# Patient Record
Sex: Male | Born: 1935 | Race: White | Hispanic: No | Marital: Married | Smoking: Never smoker
Health system: Southern US, Community
[De-identification: ages and names within clinical notes are randomized; demographics above are authoritative.]

## PROBLEM LIST (undated history)

## (undated) DIAGNOSIS — I2510 Atherosclerotic heart disease of native coronary artery without angina pectoris: Secondary | ICD-10-CM

## (undated) DIAGNOSIS — I10 Essential (primary) hypertension: Secondary | ICD-10-CM

## (undated) DIAGNOSIS — E785 Hyperlipidemia, unspecified: Secondary | ICD-10-CM

## (undated) DIAGNOSIS — I319 Disease of pericardium, unspecified: Secondary | ICD-10-CM

## (undated) HISTORY — PX: CARDIAC CATHETERIZATION: SHX172

## (undated) HISTORY — DX: Disease of pericardium, unspecified: I31.9

## (undated) HISTORY — PX: CORONARY ARTERY BYPASS GRAFT: SHX141

## (undated) HISTORY — PX: ARTERIOVENOUS GRAFT PLACEMENT W/ ENDOSCOPIC VEIN HARVEST: SUR1030

## (undated) HISTORY — DX: Essential (primary) hypertension: I10

## (undated) HISTORY — DX: Hyperlipidemia, unspecified: E78.5

## (undated) HISTORY — DX: Atherosclerotic heart disease of native coronary artery without angina pectoris: I25.10

---

## 2002-08-16 ENCOUNTER — Encounter: Admission: RE | Admit: 2002-08-16 | Discharge: 2002-08-16 | Payer: Self-pay | Admitting: General Surgery

## 2002-08-16 ENCOUNTER — Encounter: Payer: Self-pay | Admitting: General Surgery

## 2002-08-19 ENCOUNTER — Ambulatory Visit (HOSPITAL_BASED_OUTPATIENT_CLINIC_OR_DEPARTMENT_OTHER): Admission: RE | Admit: 2002-08-19 | Discharge: 2002-08-19 | Payer: Self-pay | Admitting: General Surgery

## 2002-10-22 ENCOUNTER — Encounter (INDEPENDENT_AMBULATORY_CARE_PROVIDER_SITE_OTHER): Payer: Self-pay | Admitting: *Deleted

## 2002-10-22 ENCOUNTER — Ambulatory Visit (HOSPITAL_BASED_OUTPATIENT_CLINIC_OR_DEPARTMENT_OTHER): Admission: RE | Admit: 2002-10-22 | Discharge: 2002-10-22 | Payer: Self-pay | Admitting: General Surgery

## 2003-04-01 ENCOUNTER — Encounter: Admission: RE | Admit: 2003-04-01 | Discharge: 2003-04-01 | Payer: Self-pay | Admitting: General Surgery

## 2004-10-29 IMAGING — US US SCROTUM
1 series · 14 of 25 positions shown · non-contrast
Comparison: none

CLINICAL DATA: Left testicular pain.
 ULTRASOUND OF THE SCROTUM
 Scans over the scrotum were performed.  Both testicles are well visualized and normal in echogenicity.  There is blood flow to both testicles.   The right testicle measures 3.7 cm sagittally with a depth of 2.7 cm and width of 2.9 cm.  The left testicle measures 4.5 cm sagittally with a depth of 2.0 cm and a width of 3.5 cm.  The epididymis appears normal bilaterally.  There are small bilateral hydroceles with septations.  Inferior to the testicles, there are small rounded soft tissue masses, most consistent with ?scrotal pearls? which are a product of prior inflammation. 
 IMPRESSION
 1.  No acute abnormality.  Blood flow is documented to both testicles.  
 2.  Small hydrocele with septation and scrotal pearls bilaterally.

[Series 1: unknown · 0.09mm/px · 14 of 42 slices shown]
[im 1/42]
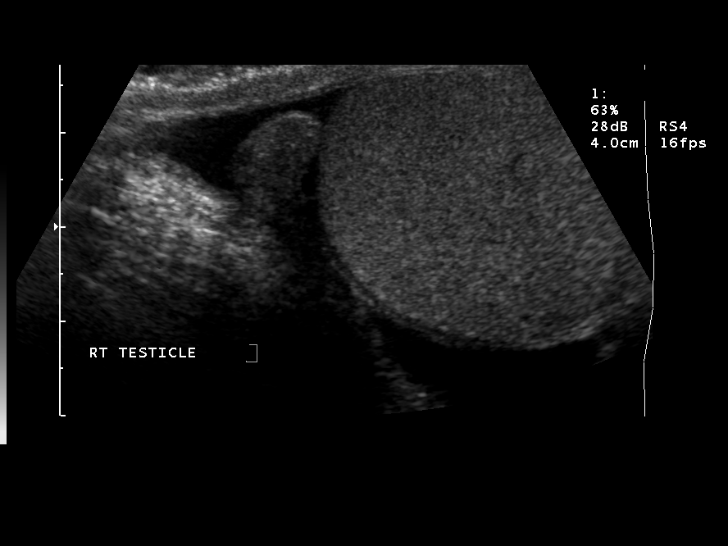
[im 4/42]
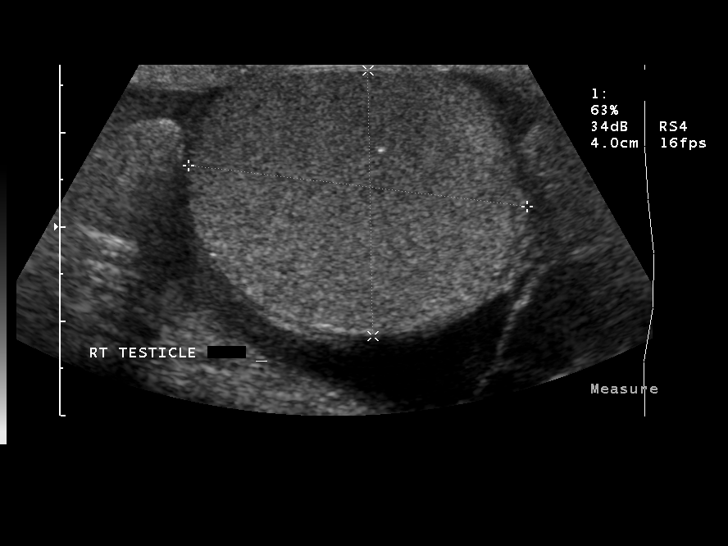
[im 7/42]
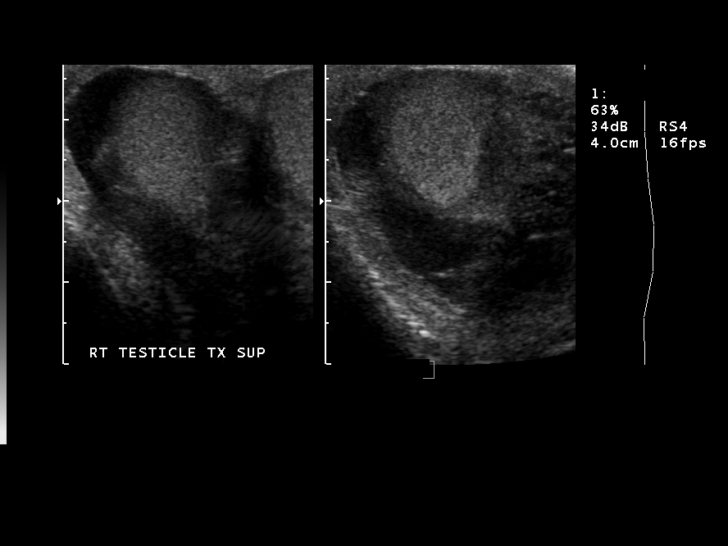
[im 11/42]
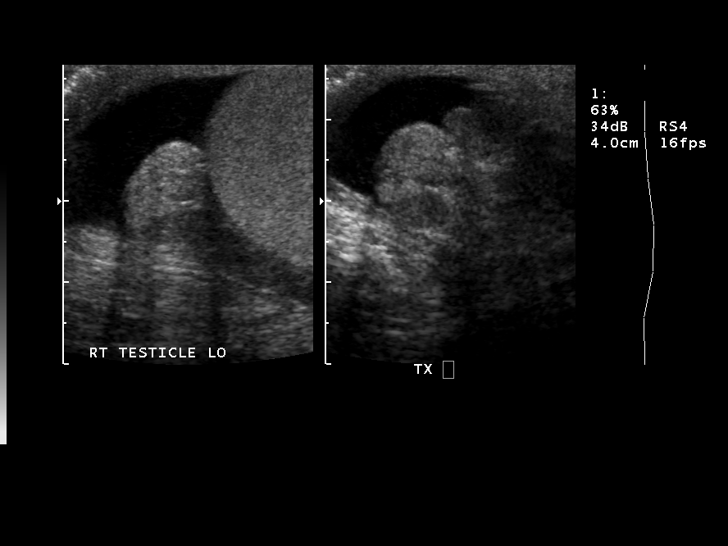
[im 14/42]
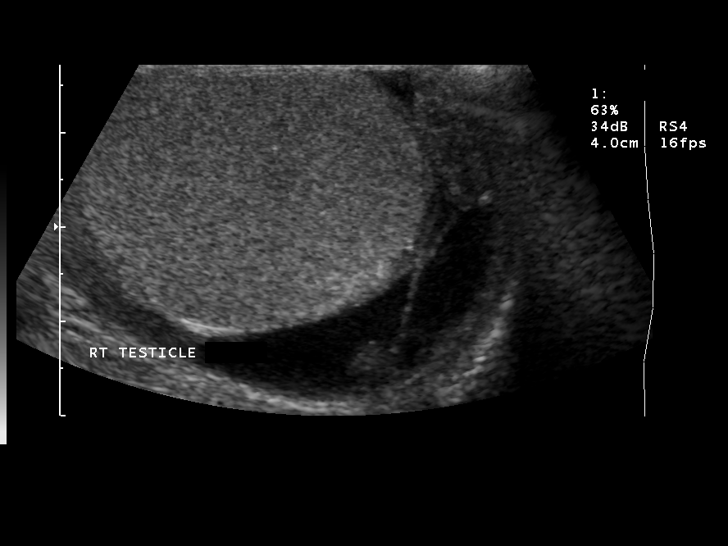
[im 16/42]
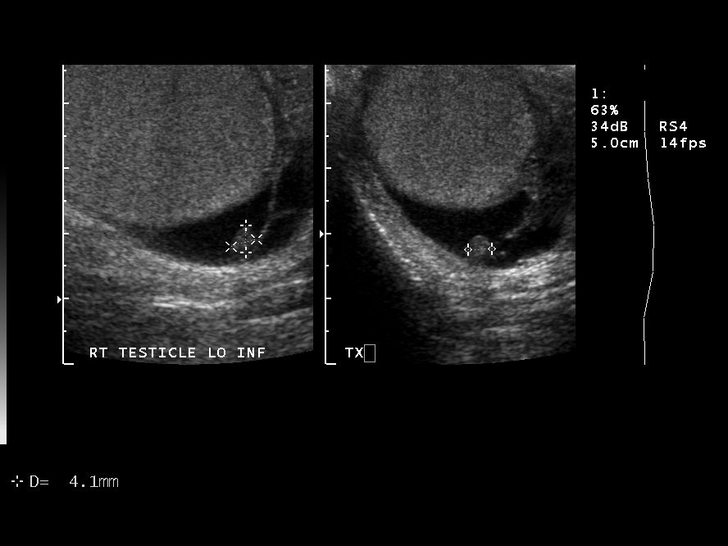
[im 19/42]
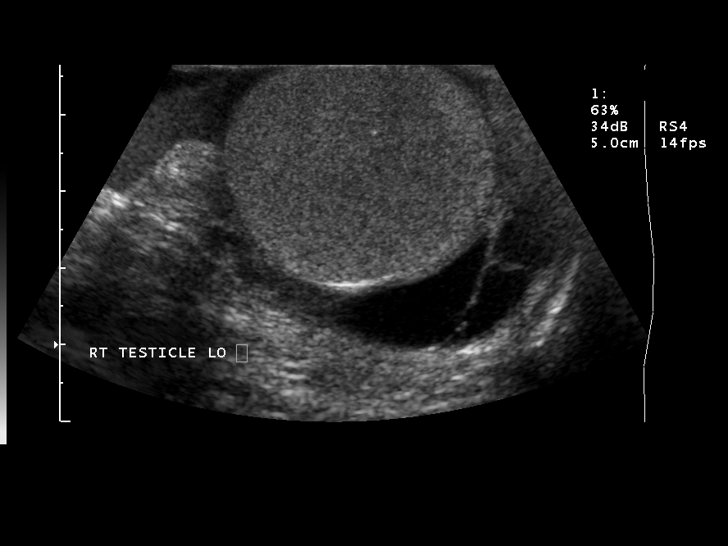
[im 23/42]
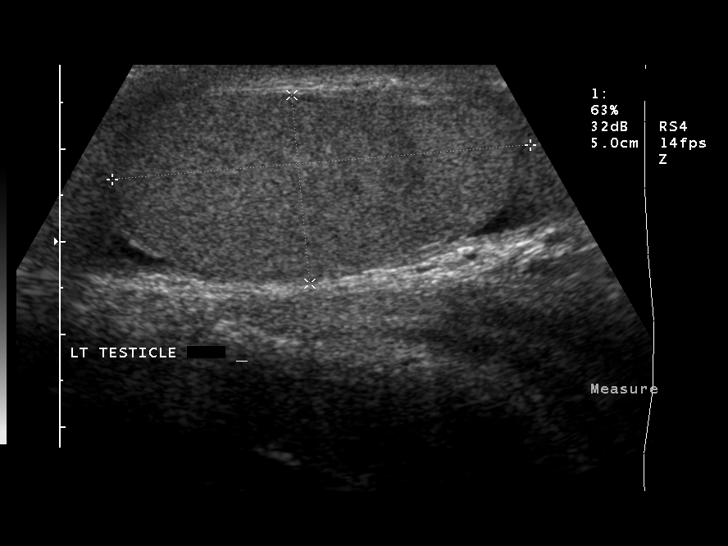
[im 26/42]
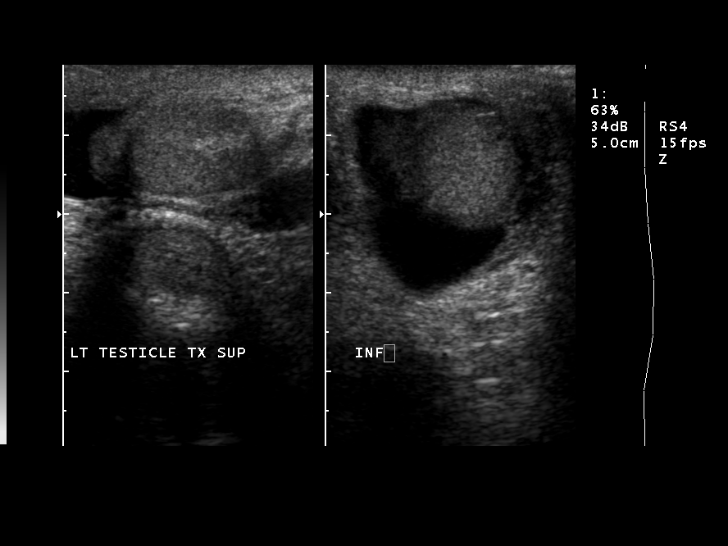
[im 28/42]
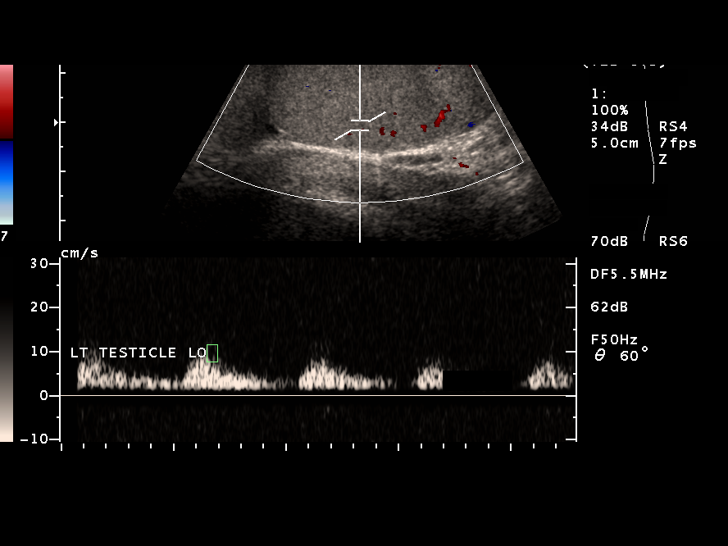
[im 31/42]
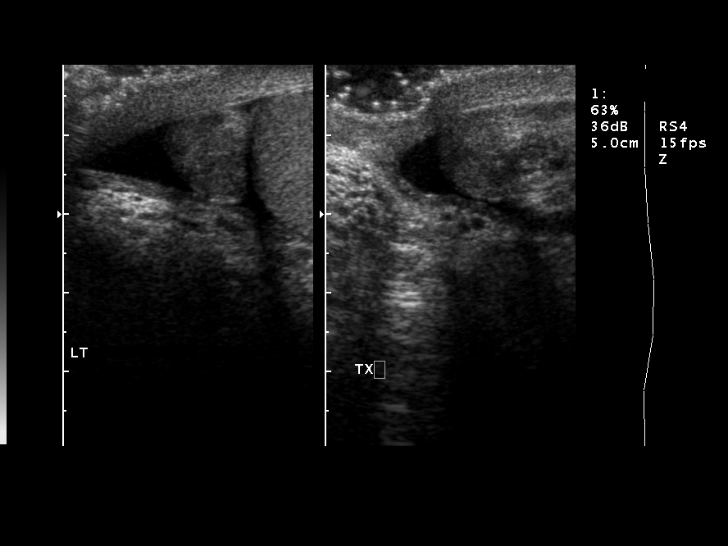
[im 35/42]
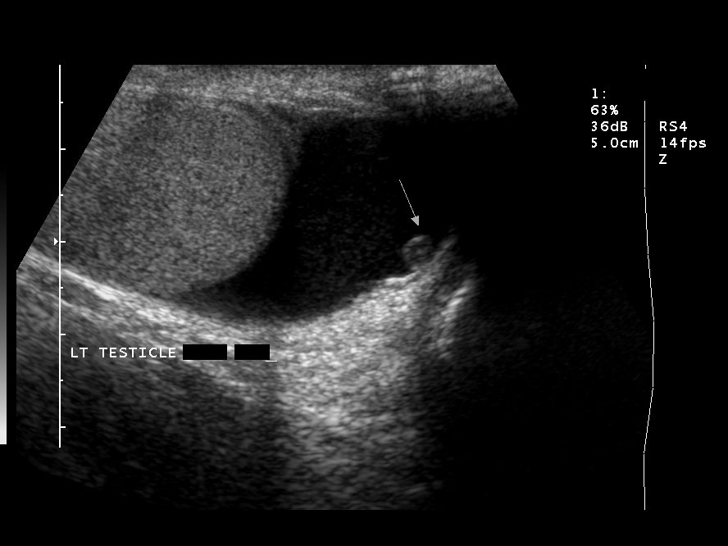
[im 38/42]
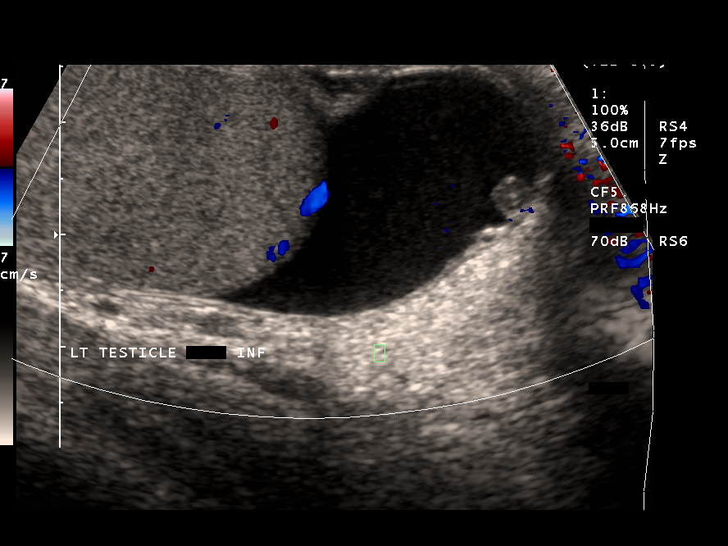
[im 42/42]
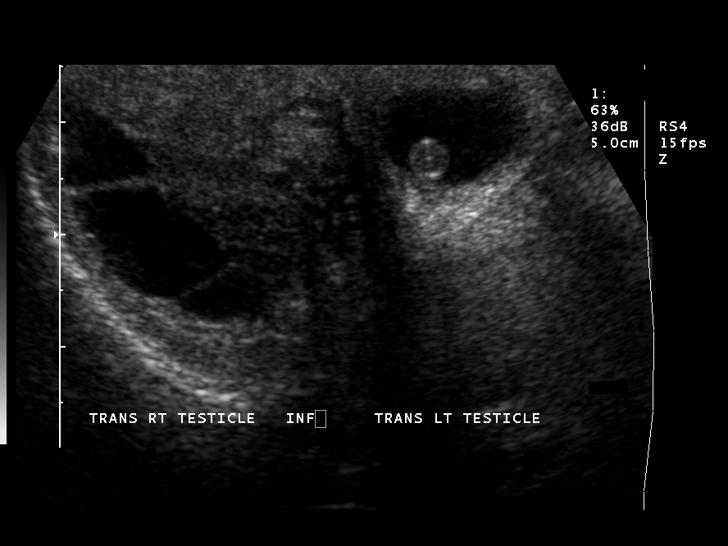

[14 of 25 positions shown; findings below may reference images not displayed]

## 2006-07-28 ENCOUNTER — Encounter (INDEPENDENT_AMBULATORY_CARE_PROVIDER_SITE_OTHER): Payer: Self-pay | Admitting: Specialist

## 2006-07-28 ENCOUNTER — Ambulatory Visit (HOSPITAL_COMMUNITY): Admission: RE | Admit: 2006-07-28 | Discharge: 2006-07-28 | Payer: Self-pay | Admitting: *Deleted

## 2008-10-27 ENCOUNTER — Ambulatory Visit (HOSPITAL_COMMUNITY): Admission: RE | Admit: 2008-10-27 | Discharge: 2008-10-27 | Payer: Self-pay | Admitting: *Deleted

## 2009-01-12 ENCOUNTER — Encounter: Payer: Self-pay | Admitting: Cardiothoracic Surgery

## 2009-01-12 ENCOUNTER — Ambulatory Visit: Payer: Self-pay | Admitting: Cardiothoracic Surgery

## 2009-01-12 ENCOUNTER — Ambulatory Visit (HOSPITAL_COMMUNITY): Admission: RE | Admit: 2009-01-12 | Discharge: 2009-01-13 | Payer: Self-pay | Admitting: Cardiovascular Disease

## 2009-01-13 ENCOUNTER — Encounter: Payer: Self-pay | Admitting: Cardiothoracic Surgery

## 2009-01-18 ENCOUNTER — Inpatient Hospital Stay (HOSPITAL_COMMUNITY): Admission: RE | Admit: 2009-01-18 | Discharge: 2009-01-22 | Payer: Self-pay | Admitting: Cardiothoracic Surgery

## 2009-02-13 ENCOUNTER — Encounter (HOSPITAL_COMMUNITY): Admission: RE | Admit: 2009-02-13 | Discharge: 2009-04-21 | Payer: Self-pay | Admitting: Cardiovascular Disease

## 2009-02-23 ENCOUNTER — Encounter: Admission: RE | Admit: 2009-02-23 | Discharge: 2009-02-23 | Payer: Self-pay | Admitting: Cardiothoracic Surgery

## 2009-02-23 ENCOUNTER — Ambulatory Visit: Payer: Self-pay | Admitting: Cardiothoracic Surgery

## 2010-07-26 LAB — CBC
HCT: 33.2 % — ABNORMAL LOW (ref 39.0–52.0)
HCT: 34.7 % — ABNORMAL LOW (ref 39.0–52.0)
Hemoglobin: 11.2 g/dL — ABNORMAL LOW (ref 13.0–17.0)
Hemoglobin: 11.8 g/dL — ABNORMAL LOW (ref 13.0–17.0)
MCHC: 33.8 g/dL (ref 30.0–36.0)
MCHC: 34 g/dL (ref 30.0–36.0)
MCV: 93 fL (ref 78.0–100.0)
MCV: 94.2 fL (ref 78.0–100.0)
Platelets: 140 10*3/uL — ABNORMAL LOW (ref 150–400)
Platelets: 163 10*3/uL (ref 150–400)
RBC: 3.53 MIL/uL — ABNORMAL LOW (ref 4.22–5.81)
RBC: 3.73 MIL/uL — ABNORMAL LOW (ref 4.22–5.81)
RDW: 13.3 % (ref 11.5–15.5)
RDW: 13.6 % (ref 11.5–15.5)
WBC: 10.6 10*3/uL — ABNORMAL HIGH (ref 4.0–10.5)
WBC: 17.2 10*3/uL — ABNORMAL HIGH (ref 4.0–10.5)

## 2010-07-26 LAB — BASIC METABOLIC PANEL
BUN: 24 mg/dL — ABNORMAL HIGH (ref 6–23)
BUN: 25 mg/dL — ABNORMAL HIGH (ref 6–23)
CO2: 27 mEq/L (ref 19–32)
CO2: 27 mEq/L (ref 19–32)
Calcium: 8.4 mg/dL (ref 8.4–10.5)
Calcium: 8.6 mg/dL (ref 8.4–10.5)
Chloride: 104 mEq/L (ref 96–112)
Chloride: 104 mEq/L (ref 96–112)
Creatinine, Ser: 1.24 mg/dL (ref 0.4–1.5)
Creatinine, Ser: 1.34 mg/dL (ref 0.4–1.5)
GFR calc Af Amer: >60 mL/min (ref >60)
GFR calc Af Amer: >60 mL/min (ref >60)
GFR calc non Af Amer: 52 mL/min — ABNORMAL LOW (ref >60)
GFR calc non Af Amer: 57 mL/min — ABNORMAL LOW (ref >60)
Glucose, Bld: 108 mg/dL — ABNORMAL HIGH (ref 70–99)
Glucose, Bld: 139 mg/dL — ABNORMAL HIGH (ref 70–99)
Potassium: 3.6 mEq/L (ref 3.5–5.1)
Potassium: 4 mEq/L (ref 3.5–5.1)
Sodium: 137 mEq/L (ref 135–145)
Sodium: 138 mEq/L (ref 135–145)

## 2010-07-26 LAB — GLUCOSE, CAPILLARY
Glucose-Capillary: 110 mg/dL — ABNORMAL HIGH (ref 70–99)
Glucose-Capillary: 125 mg/dL — ABNORMAL HIGH (ref 70–99)

## 2010-07-27 LAB — POCT I-STAT 4, (NA,K, GLUC, HGB,HCT)
Glucose, Bld: 102 mg/dL — ABNORMAL HIGH (ref 70–99)
Glucose, Bld: 107 mg/dL — ABNORMAL HIGH (ref 70–99)
Glucose, Bld: 107 mg/dL — ABNORMAL HIGH (ref 70–99)
Glucose, Bld: 109 mg/dL — ABNORMAL HIGH (ref 70–99)
Glucose, Bld: 112 mg/dL — ABNORMAL HIGH (ref 70–99)
Glucose, Bld: 114 mg/dL — ABNORMAL HIGH (ref 70–99)
Glucose, Bld: 126 mg/dL — ABNORMAL HIGH (ref 70–99)
Glucose, Bld: 66 mg/dL — ABNORMAL LOW (ref 70–99)
HCT: 23 % — ABNORMAL LOW (ref 39.0–52.0)
HCT: 25 % — ABNORMAL LOW (ref 39.0–52.0)
HCT: 25 % — ABNORMAL LOW (ref 39.0–52.0)
HCT: 26 % — ABNORMAL LOW (ref 39.0–52.0)
HCT: 26 % — ABNORMAL LOW (ref 39.0–52.0)
HCT: 29 % — ABNORMAL LOW (ref 39.0–52.0)
HCT: 33 % — ABNORMAL LOW (ref 39.0–52.0)
HCT: 34 % — ABNORMAL LOW (ref 39.0–52.0)
Hemoglobin: 11.2 g/dL — ABNORMAL LOW (ref 13.0–17.0)
Hemoglobin: 11.6 g/dL — ABNORMAL LOW (ref 13.0–17.0)
Hemoglobin: 7.8 g/dL — CL (ref 13.0–17.0)
Hemoglobin: 8.5 g/dL — ABNORMAL LOW (ref 13.0–17.0)
Hemoglobin: 8.5 g/dL — ABNORMAL LOW (ref 13.0–17.0)
Hemoglobin: 8.8 g/dL — ABNORMAL LOW (ref 13.0–17.0)
Hemoglobin: 8.8 g/dL — ABNORMAL LOW (ref 13.0–17.0)
Hemoglobin: 9.9 g/dL — ABNORMAL LOW (ref 13.0–17.0)
Potassium: 3.3 mEq/L — ABNORMAL LOW (ref 3.5–5.1)
Potassium: 4.2 mEq/L (ref 3.5–5.1)
Potassium: 4.2 mEq/L (ref 3.5–5.1)
Potassium: 4.4 mEq/L (ref 3.5–5.1)
Potassium: 4.7 mEq/L (ref 3.5–5.1)
Potassium: 5.6 mEq/L — ABNORMAL HIGH (ref 3.5–5.1)
Potassium: 5.7 mEq/L — ABNORMAL HIGH (ref 3.5–5.1)
Potassium: 6.5 mEq/L (ref 3.5–5.1)
Sodium: 130 mEq/L — ABNORMAL LOW (ref 135–145)
Sodium: 132 mEq/L — ABNORMAL LOW (ref 135–145)
Sodium: 133 mEq/L — ABNORMAL LOW (ref 135–145)
Sodium: 135 mEq/L (ref 135–145)
Sodium: 139 mEq/L (ref 135–145)
Sodium: 139 mEq/L (ref 135–145)
Sodium: 140 mEq/L (ref 135–145)
Sodium: 143 mEq/L (ref 135–145)

## 2010-07-27 LAB — TYPE AND SCREEN
ABO/RH(D): A POS
Antibody Screen: NEGATIVE

## 2010-07-27 LAB — CBC
HCT: 26.9 % — ABNORMAL LOW (ref 39.0–52.0)
HCT: 30.6 % — ABNORMAL LOW (ref 39.0–52.0)
HCT: 31.1 % — ABNORMAL LOW (ref 39.0–52.0)
HCT: 31.2 % — ABNORMAL LOW (ref 39.0–52.0)
HCT: 37.5 % — ABNORMAL LOW (ref 39.0–52.0)
Hemoglobin: 10.4 g/dL — ABNORMAL LOW (ref 13.0–17.0)
Hemoglobin: 10.5 g/dL — ABNORMAL LOW (ref 13.0–17.0)
Hemoglobin: 10.7 g/dL — ABNORMAL LOW (ref 13.0–17.0)
Hemoglobin: 12.8 g/dL — ABNORMAL LOW (ref 13.0–17.0)
Hemoglobin: 9.3 g/dL — ABNORMAL LOW (ref 13.0–17.0)
MCHC: 33.8 g/dL (ref 30.0–36.0)
MCHC: 33.9 g/dL (ref 30.0–36.0)
MCHC: 34.2 g/dL (ref 30.0–36.0)
MCHC: 34.2 g/dL (ref 30.0–36.0)
MCHC: 34.5 g/dL (ref 30.0–36.0)
MCV: 92.8 fL (ref 78.0–100.0)
MCV: 93.3 fL (ref 78.0–100.0)
MCV: 93.7 fL (ref 78.0–100.0)
MCV: 93.7 fL (ref 78.0–100.0)
MCV: 95 fL (ref 78.0–100.0)
Platelets: 122 10*3/uL — ABNORMAL LOW (ref 150–400)
Platelets: 126 10*3/uL — ABNORMAL LOW (ref 150–400)
Platelets: 130 10*3/uL — ABNORMAL LOW (ref 150–400)
Platelets: 169 10*3/uL (ref 150–400)
Platelets: 83 10*3/uL — ABNORMAL LOW (ref 150–400)
RBC: 2.88 MIL/uL — ABNORMAL LOW (ref 4.22–5.81)
RBC: 3.26 MIL/uL — ABNORMAL LOW (ref 4.22–5.81)
RBC: 3.27 MIL/uL — ABNORMAL LOW (ref 4.22–5.81)
RBC: 3.36 MIL/uL — ABNORMAL LOW (ref 4.22–5.81)
RBC: 4 MIL/uL — ABNORMAL LOW (ref 4.22–5.81)
RDW: 12.7 % (ref 11.5–15.5)
RDW: 13 % (ref 11.5–15.5)
RDW: 13 % (ref 11.5–15.5)
RDW: 13.4 % (ref 11.5–15.5)
RDW: 13.5 % (ref 11.5–15.5)
WBC: 14.3 10*3/uL — ABNORMAL HIGH (ref 4.0–10.5)
WBC: 14.7 10*3/uL — ABNORMAL HIGH (ref 4.0–10.5)
WBC: 17.7 10*3/uL — ABNORMAL HIGH (ref 4.0–10.5)
WBC: 3.5 10*3/uL — ABNORMAL LOW (ref 4.0–10.5)
WBC: 7.3 10*3/uL (ref 4.0–10.5)

## 2010-07-27 LAB — COMPREHENSIVE METABOLIC PANEL
ALT: 25 U/L (ref 0–53)
AST: 30 U/L (ref 0–37)
Albumin: 3.7 g/dL (ref 3.5–5.2)
Alkaline Phosphatase: 51 U/L (ref 39–117)
BUN: 14 mg/dL (ref 6–23)
CO2: 29 mEq/L (ref 19–32)
Calcium: 8.9 mg/dL (ref 8.4–10.5)
Chloride: 104 mEq/L (ref 96–112)
Creatinine, Ser: 1.08 mg/dL (ref 0.4–1.5)
GFR calc Af Amer: >60 mL/min (ref >60)
GFR calc non Af Amer: >60 mL/min (ref >60)
Glucose, Bld: 106 mg/dL — ABNORMAL HIGH (ref 70–99)
Potassium: 4.3 mEq/L (ref 3.5–5.1)
Sodium: 137 mEq/L (ref 135–145)
Total Bilirubin: 0.8 mg/dL (ref 0.3–1.2)
Total Protein: 6.6 g/dL (ref 6.0–8.3)

## 2010-07-27 LAB — POCT I-STAT 3, ART BLOOD GAS (G3+)
Acid-base deficit: 1 mmol/L (ref 0.0–2.0)
Acid-base deficit: 1 mmol/L (ref 0.0–2.0)
Acid-base deficit: 1 mmol/L (ref 0.0–2.0)
Acid-base deficit: 2 mmol/L (ref 0.0–2.0)
Acid-base deficit: 4 mmol/L — ABNORMAL HIGH (ref 0.0–2.0)
Bicarbonate: 22.6 mEq/L (ref 20.0–24.0)
Bicarbonate: 23.3 mEq/L (ref 20.0–24.0)
Bicarbonate: 23.8 mEq/L (ref 20.0–24.0)
Bicarbonate: 23.9 mEq/L (ref 20.0–24.0)
Bicarbonate: 24.1 mEq/L — ABNORMAL HIGH (ref 20.0–24.0)
Bicarbonate: 24.8 mEq/L — ABNORMAL HIGH (ref 20.0–24.0)
O2 Saturation: 100 %
O2 Saturation: 100 %
O2 Saturation: 100 %
O2 Saturation: 97 %
O2 Saturation: 98 %
O2 Saturation: 98 %
Patient temperature: 35.7
Patient temperature: 36.7
Patient temperature: 98.8
TCO2: 24 mmol/L (ref 0–100)
TCO2: 24 mmol/L (ref 0–100)
TCO2: 25 mmol/L (ref 0–100)
TCO2: 25 mmol/L (ref 0–100)
TCO2: 25 mmol/L (ref 0–100)
TCO2: 26 mmol/L (ref 0–100)
pCO2 arterial: 38.8 mmHg (ref 35.0–45.0)
pCO2 arterial: 39.3 mmHg (ref 35.0–45.0)
pCO2 arterial: 40.1 mmHg (ref 35.0–45.0)
pCO2 arterial: 40.3 mmHg (ref 35.0–45.0)
pCO2 arterial: 41.5 mmHg (ref 35.0–45.0)
pCO2 arterial: 42.3 mmHg (ref 35.0–45.0)
pH, Arterial: 7.328 — ABNORMAL LOW (ref 7.350–7.450)
pH, Arterial: 7.381 (ref 7.350–7.450)
pH, Arterial: 7.384 (ref 7.350–7.450)
pH, Arterial: 7.384 (ref 7.350–7.450)
pH, Arterial: 7.387 (ref 7.350–7.450)
pH, Arterial: 7.393 (ref 7.350–7.450)
pO2, Arterial: 107 mmHg — ABNORMAL HIGH (ref 80.0–100.0)
pO2, Arterial: 238 mmHg — ABNORMAL HIGH (ref 80.0–100.0)
pO2, Arterial: 276 mmHg — ABNORMAL HIGH (ref 80.0–100.0)
pO2, Arterial: 528 mmHg — ABNORMAL HIGH (ref 80.0–100.0)
pO2, Arterial: 91 mmHg (ref 80.0–100.0)
pO2, Arterial: 99 mmHg (ref 80.0–100.0)

## 2010-07-27 LAB — PROTIME-INR
INR: 1 (ref 0.00–1.49)
INR: 1.5 (ref 0.00–1.49)
Prothrombin Time: 13.5 seconds (ref 11.6–15.2)
Prothrombin Time: 17.8 seconds — ABNORMAL HIGH (ref 11.6–15.2)

## 2010-07-27 LAB — DIFFERENTIAL
Basophils Absolute: 0 10*3/uL (ref 0.0–0.1)
Basophils Relative: 0 % (ref 0–1)
Eosinophils Absolute: 0.1 10*3/uL (ref 0.0–0.7)
Eosinophils Relative: 0 % (ref 0–5)
Lymphocytes Relative: 2 % — ABNORMAL LOW (ref 12–46)
Lymphs Abs: 0.3 10*3/uL — ABNORMAL LOW (ref 0.7–4.0)
Monocytes Absolute: 1.2 10*3/uL — ABNORMAL HIGH (ref 0.1–1.0)
Monocytes Relative: 8 % (ref 3–12)
Neutro Abs: 13.2 10*3/uL — ABNORMAL HIGH (ref 1.7–7.7)
Neutrophils Relative %: 89 % — ABNORMAL HIGH (ref 43–77)

## 2010-07-27 LAB — GLUCOSE, CAPILLARY
Glucose-Capillary: 132 mg/dL — ABNORMAL HIGH (ref 70–99)
Glucose-Capillary: 137 mg/dL — ABNORMAL HIGH (ref 70–99)
Glucose-Capillary: 150 mg/dL — ABNORMAL HIGH (ref 70–99)
Glucose-Capillary: 151 mg/dL — ABNORMAL HIGH (ref 70–99)
Glucose-Capillary: 159 mg/dL — ABNORMAL HIGH (ref 70–99)
Glucose-Capillary: 172 mg/dL — ABNORMAL HIGH (ref 70–99)
Glucose-Capillary: 76 mg/dL (ref 70–99)

## 2010-07-27 LAB — APTT
aPTT: 29 seconds (ref 24–37)
aPTT: 34 seconds (ref 24–37)

## 2010-07-27 LAB — BLOOD GAS, ARTERIAL
Acid-Base Excess: 0.6 mmol/L (ref 0.0–2.0)
Bicarbonate: 24.4 mEq/L — ABNORMAL HIGH (ref 20.0–24.0)
FIO2: 0.21 %
O2 Saturation: 96 %
Patient temperature: 98.6
TCO2: 25.5 mmol/L (ref 0–100)
pCO2 arterial: 37.1 mmHg (ref 35.0–45.0)
pH, Arterial: 7.433 (ref 7.350–7.450)
pO2, Arterial: 75 mmHg — ABNORMAL LOW (ref 80.0–100.0)

## 2010-07-27 LAB — BASIC METABOLIC PANEL
BUN: 13 mg/dL (ref 6–23)
BUN: 21 mg/dL (ref 6–23)
CO2: 23 mEq/L (ref 19–32)
CO2: 25 mEq/L (ref 19–32)
Calcium: 7.5 mg/dL — ABNORMAL LOW (ref 8.4–10.5)
Calcium: 8 mg/dL — ABNORMAL LOW (ref 8.4–10.5)
Chloride: 110 mEq/L (ref 96–112)
Chloride: 112 mEq/L (ref 96–112)
Creatinine, Ser: 1.11 mg/dL (ref 0.4–1.5)
Creatinine, Ser: 1.27 mg/dL (ref 0.4–1.5)
GFR calc Af Amer: >60 mL/min (ref >60)
GFR calc Af Amer: >60 mL/min (ref >60)
GFR calc non Af Amer: 55 mL/min — ABNORMAL LOW (ref >60)
GFR calc non Af Amer: >60 mL/min (ref >60)
Glucose, Bld: 156 mg/dL — ABNORMAL HIGH (ref 70–99)
Glucose, Bld: 162 mg/dL — ABNORMAL HIGH (ref 70–99)
Potassium: 3.8 mEq/L (ref 3.5–5.1)
Potassium: 3.9 mEq/L (ref 3.5–5.1)
Sodium: 140 mEq/L (ref 135–145)
Sodium: 141 mEq/L (ref 135–145)

## 2010-07-27 LAB — PLATELET FUNCTION ASSAY
Collagen / ADP: 91 seconds (ref 0–114)
Collagen / Epinephrine: 224 seconds (ref 0–184)

## 2010-07-27 LAB — POCT I-STAT 3, VENOUS BLOOD GAS (G3P V)
Acid-base deficit: 4 mmol/L — ABNORMAL HIGH (ref 0.0–2.0)
Bicarbonate: 22 mEq/L (ref 20.0–24.0)
O2 Saturation: 83 %
TCO2: 23 mmol/L (ref 0–100)
pCO2, Ven: 40.5 mmHg — ABNORMAL LOW (ref 45.0–50.0)
pH, Ven: 7.343 — ABNORMAL HIGH (ref 7.250–7.300)
pO2, Ven: 51 mmHg — ABNORMAL HIGH (ref 30.0–45.0)

## 2010-07-27 LAB — MAGNESIUM
Magnesium: 2.4 mg/dL (ref 1.5–2.5)
Magnesium: 2.4 mg/dL (ref 1.5–2.5)
Magnesium: 2.8 mg/dL — ABNORMAL HIGH (ref 1.5–2.5)

## 2010-07-27 LAB — URINALYSIS, ROUTINE W REFLEX MICROSCOPIC
Bilirubin Urine: NEGATIVE
Glucose, UA: NEGATIVE mg/dL
Hgb urine dipstick: NEGATIVE
Ketones, ur: NEGATIVE mg/dL
Nitrite: NEGATIVE
Protein, ur: NEGATIVE mg/dL
Specific Gravity, Urine: 1.023 (ref 1.005–1.030)
Urobilinogen, UA: 0.2 mg/dL (ref 0.0–1.0)
pH: 6.5 (ref 5.0–8.0)

## 2010-07-27 LAB — HEMOGLOBIN AND HEMATOCRIT, BLOOD
HCT: 24.6 % — ABNORMAL LOW (ref 39.0–52.0)
Hemoglobin: 8.6 g/dL — ABNORMAL LOW (ref 13.0–17.0)

## 2010-07-27 LAB — ABO/RH: ABO/RH(D): A POS

## 2010-07-27 LAB — POCT I-STAT, CHEM 8
BUN: 11 mg/dL (ref 6–23)
Calcium, Ion: 1.18 mmol/L (ref 1.12–1.32)
Chloride: 110 mEq/L (ref 96–112)
Creatinine, Ser: 1 mg/dL (ref 0.4–1.5)
Glucose, Bld: 152 mg/dL — ABNORMAL HIGH (ref 70–99)
HCT: 30 % — ABNORMAL LOW (ref 39.0–52.0)
Hemoglobin: 10.2 g/dL — ABNORMAL LOW (ref 13.0–17.0)
Potassium: 3.9 mEq/L (ref 3.5–5.1)
Sodium: 143 mEq/L (ref 135–145)
TCO2: 21 mmol/L (ref 0–100)

## 2010-07-27 LAB — LIPID PANEL
Cholesterol: 122 mg/dL (ref 0–200)
HDL: 41 mg/dL (ref >39)
LDL Cholesterol: 66 mg/dL (ref 0–99)
Total CHOL/HDL Ratio: 3 RATIO
Triglycerides: 74 mg/dL (ref <150)
VLDL: 15 mg/dL (ref 0–40)

## 2010-07-27 LAB — CK TOTAL AND CKMB (NOT AT ARMC)
CK, MB: 39.4 ng/mL — ABNORMAL HIGH (ref 0.3–4.0)
Relative Index: 7.8 — ABNORMAL HIGH (ref 0.0–2.5)
Total CK: 508 U/L — ABNORMAL HIGH (ref 7–232)

## 2010-07-27 LAB — HEMOGLOBIN A1C
Hgb A1c MFr Bld: 5.6 % (ref 4.6–6.1)
Mean Plasma Glucose: 114 mg/dL

## 2010-07-27 LAB — CREATININE, SERUM
Creatinine, Ser: 1.11 mg/dL (ref 0.4–1.5)
GFR calc Af Amer: >60 mL/min (ref >60)
GFR calc non Af Amer: >60 mL/min (ref >60)

## 2010-07-27 LAB — PLATELET COUNT: Platelets: 133 10*3/uL — ABNORMAL LOW (ref 150–400)

## 2010-07-27 LAB — MRSA PCR SCREENING: MRSA by PCR: NEGATIVE

## 2010-08-20 IMAGING — CR DG CHEST 1V PORT
1 series · 1 of 1 positions shown · non-contrast
Comparison: 01/19/2009

CLINICAL DATA: CABG.

PORTABLE CHEST - 1 VIEW

[AP]
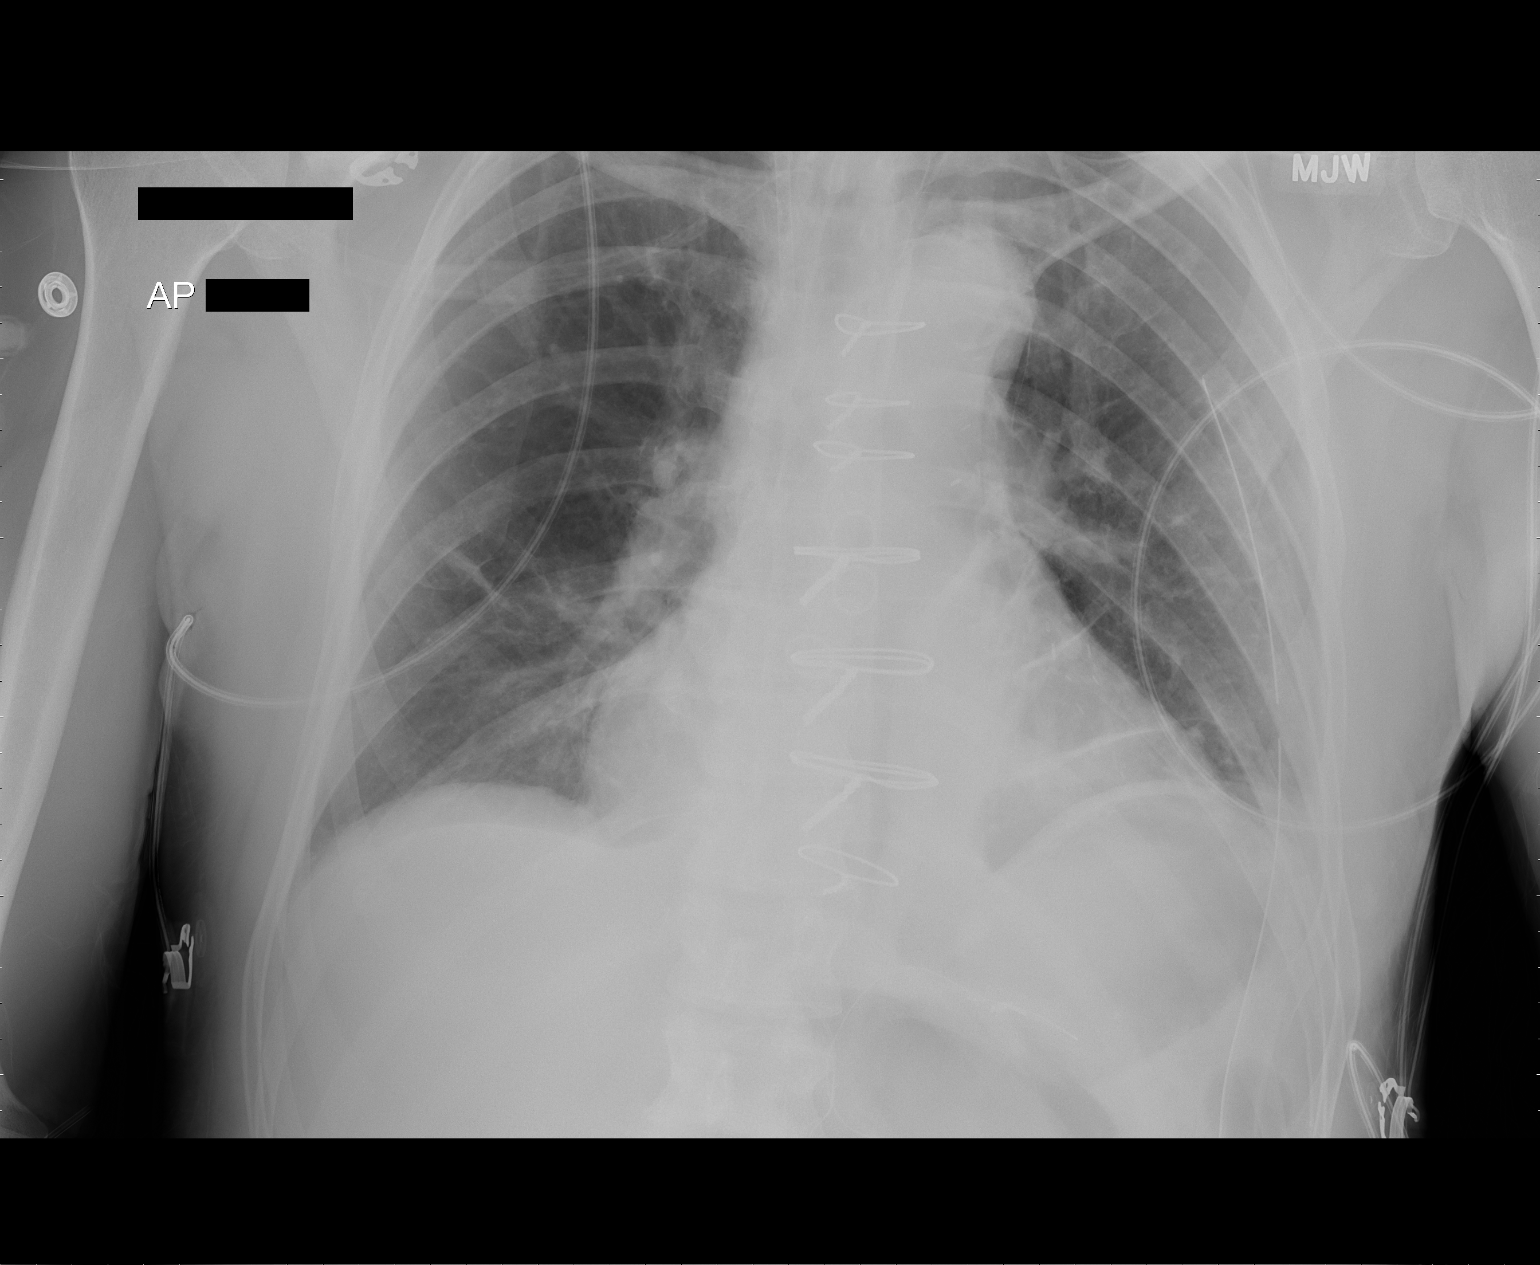

[1 of 1 positions shown; findings below may reference images not displayed]

FINDINGS: Subsegmental atelectasis at the bases.  No airspace
opacities.  No pneumothorax.  Mild cardiomegaly.  Cardiomediastinal
silhouette stable.  Ashoiy catheters been removed with catheter
sheath left in the right jugular/innominate vein.  Left pleural
chest tube in position.  Status post removal of mediastinal chest
tube.
IMPRESSION: Minimal atelectasis at the bases.

## 2010-09-04 NOTE — Assessment & Plan Note (Signed)
OFFICE VISIT   Brad Caldwell, Brad Caldwell  DOB:  [birth year]                                        February 23, 2009  CHART #:  [id number]   The patient underwent coronary artery bypass grafting x4 on 01/18/2009.  Prior to that, he had been running up to 6-7 miles a day, but because of  symptoms of chest discomfort, he was found to have critical three-vessel  coronary artery disease and underwent coronary artery bypass grafting x4  on January 18, 2009.  He has made a very rapid recovery, he is now in  cardiac rehab without difficulty.  Denies any recurrent angina or  evidence of congestive heart failure.  He noted that initially at home,  he felt very fatigued.  His Lopressor dose was decreased slightly, and  he was started on ACE inhibitor.  He notes that he has tolerated this  much better.   On exam today, his blood pressure is 132/70, pulse 56, respiratory rate  16, and O2 sat is 100%.  His sternum is stable and well healed.  Lungs  are clear bilaterally.  His right leg endovein harvest site is healing  without difficulty.  He has no pedal edema or calf tenderness.   Followup chest x-ray shows clear lung fields bilaterally.   He continues on metoprolol 12.5 mg b.i.d., 81 mg of aspirin, simvastatin  40 mg a day, and Altace 2.5 mg a day.  Also on multivitamins, fish oil,  vitamin B12, and vitamin D.   Overall, I am very pleased with his progress.  He continues to be  followed by Dr. Tresa Endo.  I have not made him a return appointment to see  me, but would be glad to see him at any time at his or Dr. Landry Dyke  request.   Sheliah Plane, MD  Electronically Signed   EG/MEDQ  D:  02/23/2009  T:  02/23/2009  Job:  [id number]   cc:   Brooke Bonito, M.D.  Nicki Guadalajara, M.D.

## 2010-09-04 NOTE — Op Note (Signed)
NAME:  Brad Caldwell, Brad Caldwell NO.:  [id number]   MEDICAL RECORD NO.:  [id number]          PATIENT TYPE:  AMB   LOCATION:  ENDO                         FACILITY:  [facility]   PHYSICIAN:  Georgiana Spinner, M.D.    DATE OF BIRTH:  05-Apr-1930   DATE OF PROCEDURE:  10/27/2008  DATE OF DISCHARGE:                               OPERATIVE REPORT   PROCEDURE:  Colonoscopy.   INDICATIONS:  Colon cancer screening.   ANESTHESIA:  Fentanyl 75 mcg, Versed 7 mg.5. __________   PROCEDURE:  With the patient mildly sedated in the left lateral  decubitus position, a rectal exam was performed which was unremarkable.  Prostate is felt slightly enlarged but no nodules were noted.  Subsequently the Pentax videoscopic pediatric colonoscope was inserted  the rectum and passed under direct vision with pressure applied to reach  the cecum, identified by ileocecal valve and appendiceal orifice both  which were photographed.  From this point, the colonoscope was slowly  withdrawn taking circumferential views of colonic mucosa stopping only  in the rectum which appeared normal on direct and showed hemorrhoids on  retroflexed view.  The endoscope was straightened and withdrawn.  The  patient's vital signs, pulse oximeter remained stable.  The patient  tolerated procedure well without apparent complications.   FINDINGS:  Internal hemorrhoids, otherwise unremarkable examination to  the cecum.   PLAN:  Repeat examination in 5-10 years.           ______________________________  Georgiana Spinner, M.D.     GMO/MEDQ  D:  10/27/2008  T:  10/27/2008  Job:  [id number]

## 2010-09-23 IMAGING — CR DG CHEST 2V
2 series · 2 of 2 positions shown · non-contrast
Comparison: 01/20/2009

CLINICAL DATA: Status post CABG

CHEST - 2 VIEW

[w chest pa]
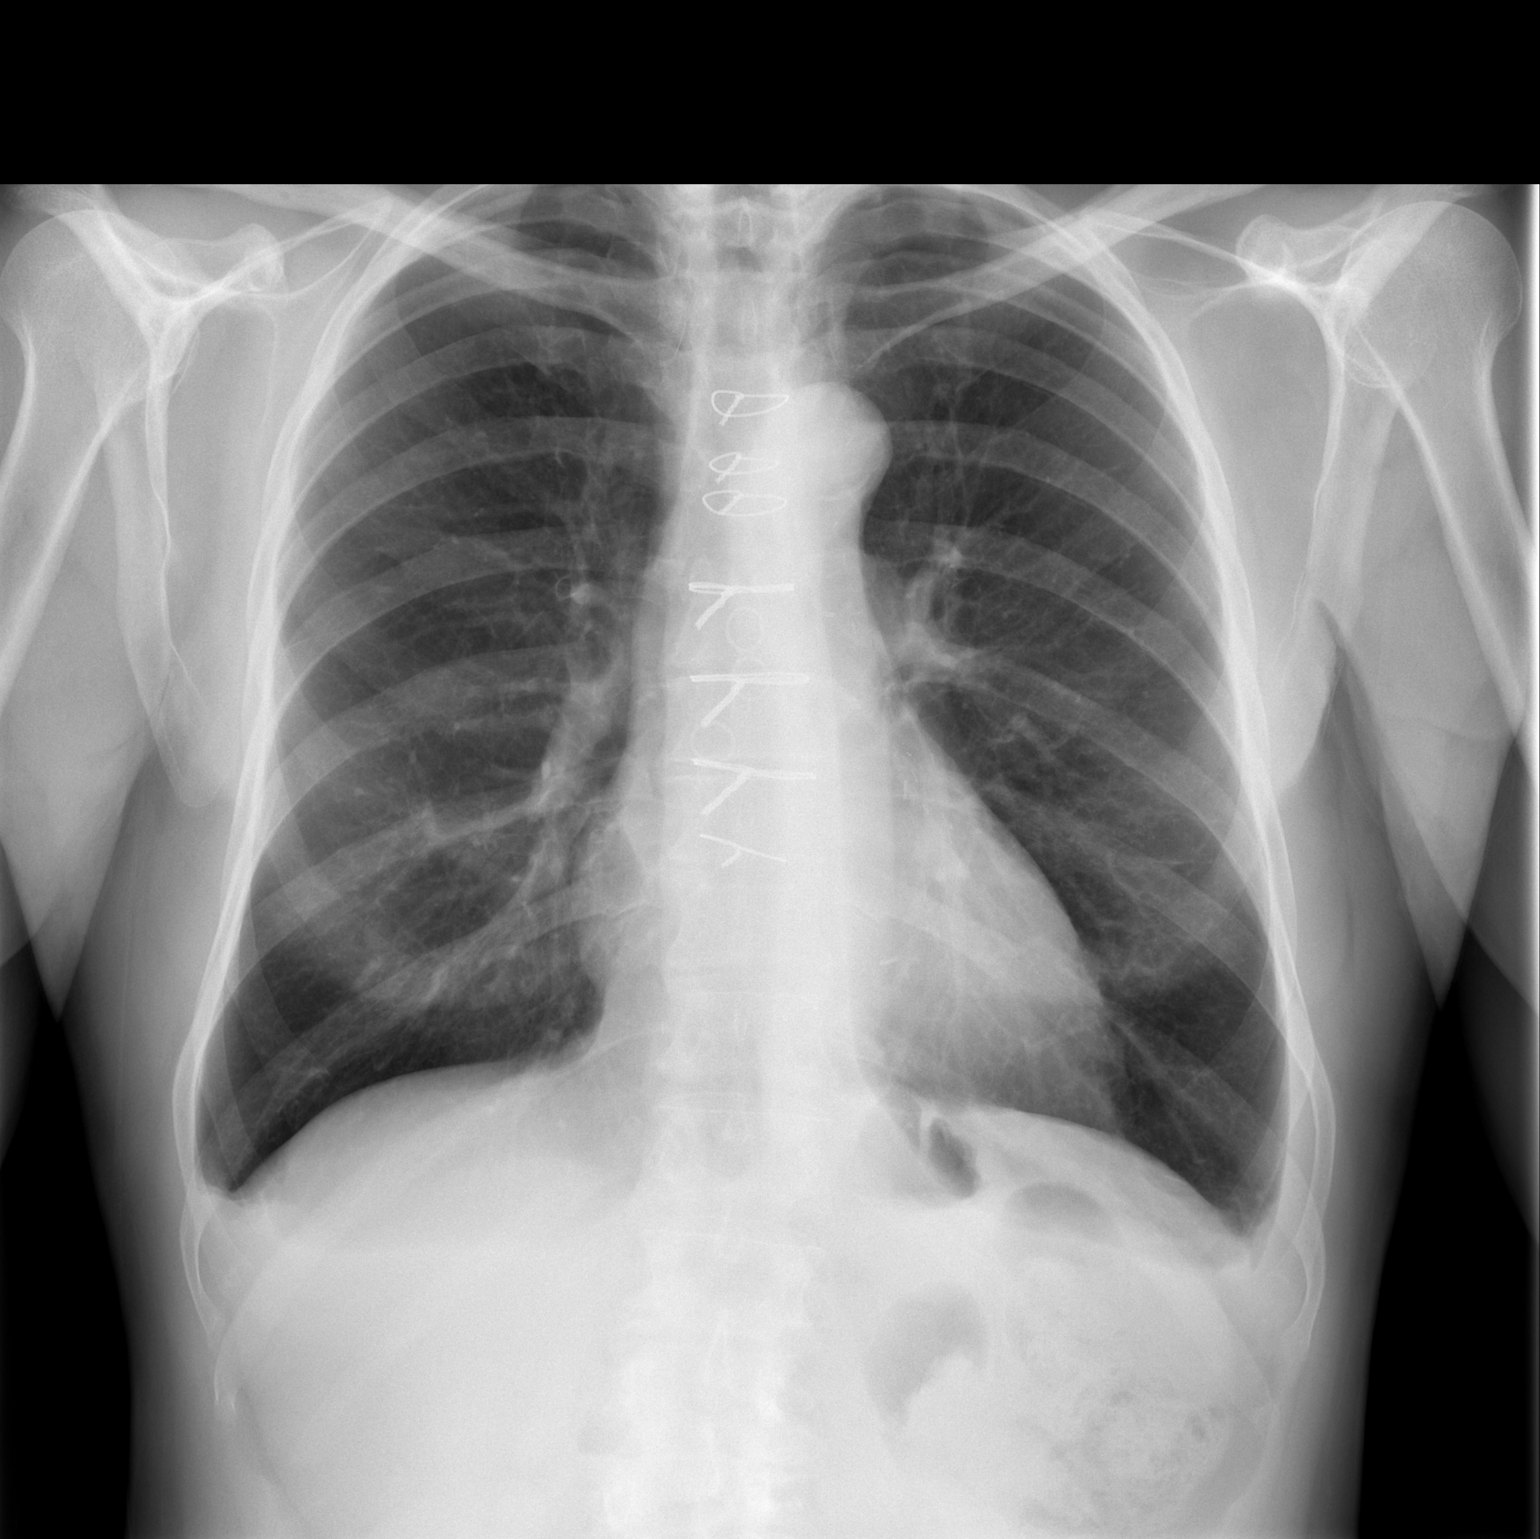

[w chest lat]
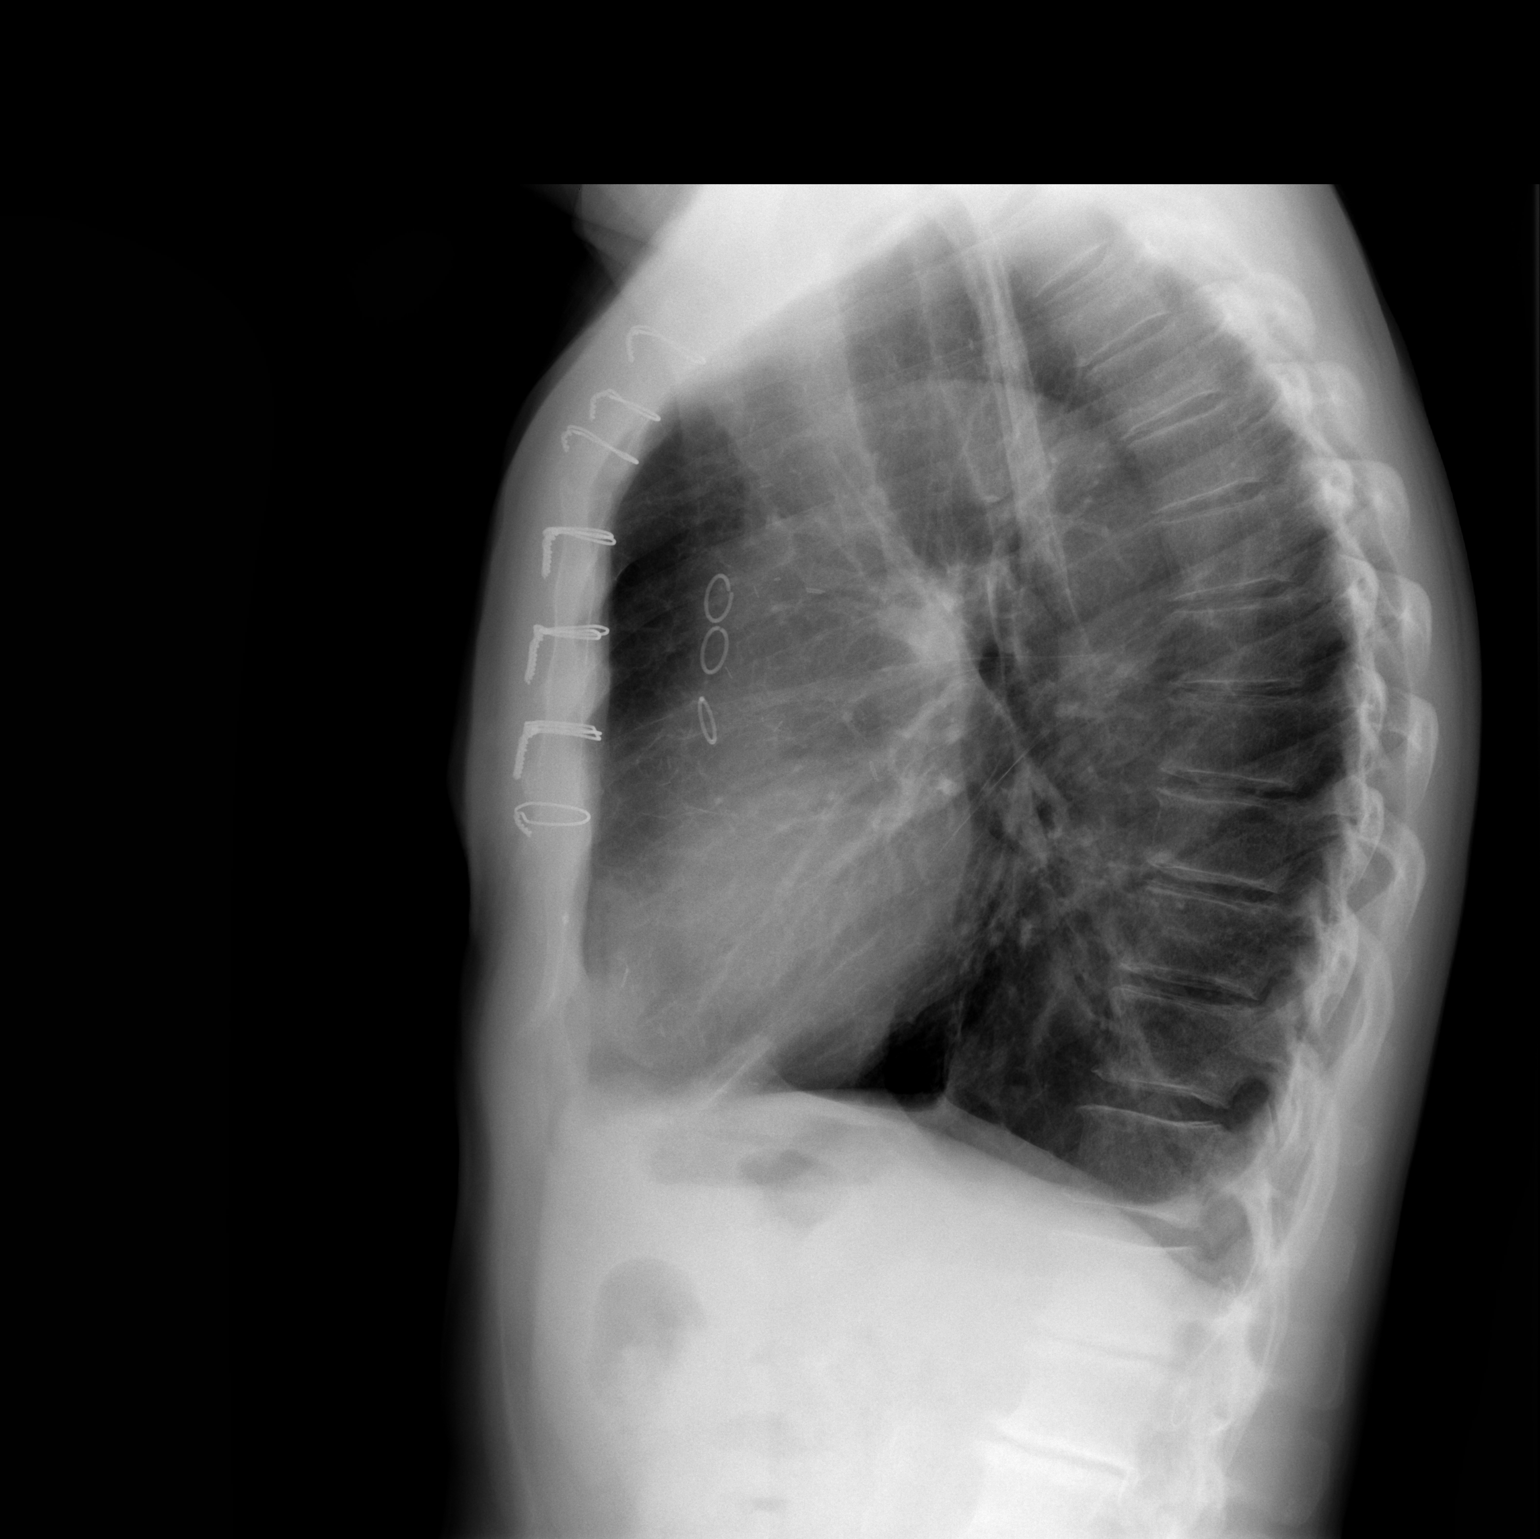

[2 of 2 positions shown; findings below may reference images not displayed]

FINDINGS: Cardiomediastinal silhouette is stable.  No acute
infiltrate .
No pulmonary edema. Bilateral trace pleural effusion.  The patient
is status post CABG. Degenerative changes lumbar spine are noted.
IMPRESSION: Status post CABG.  No acute infiltrate or edema.  Bilateral trace
pleural effusion.

## 2012-10-31 ENCOUNTER — Encounter: Payer: Self-pay | Admitting: Cardiovascular Disease

## 2014-11-08 ENCOUNTER — Encounter: Payer: Self-pay | Admitting: Cardiovascular Disease

## 2014-11-15 ENCOUNTER — Encounter: Payer: Self-pay | Admitting: Cardiovascular Disease

## 2015-05-26 DIAGNOSIS — I2510 Atherosclerotic heart disease of native coronary artery without angina pectoris: Secondary | ICD-10-CM | POA: Diagnosis not present

## 2015-05-26 DIAGNOSIS — E789 Disorder of lipoprotein metabolism, unspecified: Secondary | ICD-10-CM | POA: Diagnosis not present

## 2015-05-26 DIAGNOSIS — N400 Enlarged prostate without lower urinary tract symptoms: Secondary | ICD-10-CM | POA: Diagnosis not present

## 2015-08-22 DIAGNOSIS — N400 Enlarged prostate without lower urinary tract symptoms: Secondary | ICD-10-CM | POA: Diagnosis not present

## 2015-08-29 DIAGNOSIS — R05 Cough: Secondary | ICD-10-CM | POA: Diagnosis not present

## 2015-11-29 DIAGNOSIS — I10 Essential (primary) hypertension: Secondary | ICD-10-CM | POA: Diagnosis not present

## 2015-11-29 DIAGNOSIS — E789 Disorder of lipoprotein metabolism, unspecified: Secondary | ICD-10-CM | POA: Diagnosis not present

## 2015-11-29 DIAGNOSIS — N400 Enlarged prostate without lower urinary tract symptoms: Secondary | ICD-10-CM | POA: Diagnosis not present

## 2015-12-06 DIAGNOSIS — E789 Disorder of lipoprotein metabolism, unspecified: Secondary | ICD-10-CM | POA: Diagnosis not present

## 2015-12-06 DIAGNOSIS — I2510 Atherosclerotic heart disease of native coronary artery without angina pectoris: Secondary | ICD-10-CM | POA: Diagnosis not present

## 2015-12-06 DIAGNOSIS — N400 Enlarged prostate without lower urinary tract symptoms: Secondary | ICD-10-CM | POA: Diagnosis not present

## 2016-05-20 DIAGNOSIS — Z125 Encounter for screening for malignant neoplasm of prostate: Secondary | ICD-10-CM | POA: Diagnosis not present

## 2016-05-20 DIAGNOSIS — E789 Disorder of lipoprotein metabolism, unspecified: Secondary | ICD-10-CM | POA: Diagnosis not present

## 2016-05-20 DIAGNOSIS — I10 Essential (primary) hypertension: Secondary | ICD-10-CM | POA: Diagnosis not present

## 2016-05-20 DIAGNOSIS — N400 Enlarged prostate without lower urinary tract symptoms: Secondary | ICD-10-CM | POA: Diagnosis not present

## 2016-05-20 DIAGNOSIS — E785 Hyperlipidemia, unspecified: Secondary | ICD-10-CM | POA: Diagnosis not present

## 2016-05-29 DIAGNOSIS — N400 Enlarged prostate without lower urinary tract symptoms: Secondary | ICD-10-CM | POA: Diagnosis not present

## 2016-05-29 DIAGNOSIS — E789 Disorder of lipoprotein metabolism, unspecified: Secondary | ICD-10-CM | POA: Diagnosis not present

## 2016-05-29 DIAGNOSIS — I2510 Atherosclerotic heart disease of native coronary artery without angina pectoris: Secondary | ICD-10-CM | POA: Diagnosis not present

## 2016-11-26 DIAGNOSIS — I10 Essential (primary) hypertension: Secondary | ICD-10-CM | POA: Diagnosis not present

## 2016-11-26 DIAGNOSIS — E789 Disorder of lipoprotein metabolism, unspecified: Secondary | ICD-10-CM | POA: Diagnosis not present

## 2016-12-04 DIAGNOSIS — N183 Chronic kidney disease, stage 3 (moderate): Secondary | ICD-10-CM | POA: Diagnosis not present

## 2016-12-04 DIAGNOSIS — E782 Mixed hyperlipidemia: Secondary | ICD-10-CM | POA: Diagnosis not present

## 2016-12-04 DIAGNOSIS — I2510 Atherosclerotic heart disease of native coronary artery without angina pectoris: Secondary | ICD-10-CM | POA: Diagnosis not present

## 2016-12-04 DIAGNOSIS — I129 Hypertensive chronic kidney disease with stage 1 through stage 4 chronic kidney disease, or unspecified chronic kidney disease: Secondary | ICD-10-CM | POA: Diagnosis not present

## 2017-04-06 ENCOUNTER — Other Ambulatory Visit: Payer: Self-pay

## 2017-04-06 ENCOUNTER — Inpatient Hospital Stay (HOSPITAL_COMMUNITY)
Admission: EM | Admit: 2017-04-06 | Discharge: 2017-04-09 | DRG: 728 | Disposition: A | Discharge status: Home / Self Care | Payer: Medicare Other | Attending: Internal Medicine | Admitting: Internal Medicine

## 2017-04-06 ENCOUNTER — Emergency Department (HOSPITAL_COMMUNITY): Payer: Medicare Other

## 2017-04-06 ENCOUNTER — Encounter (HOSPITAL_COMMUNITY): Payer: Self-pay | Admitting: *Deleted

## 2017-04-06 DIAGNOSIS — Z79899 Other long term (current) drug therapy: Secondary | ICD-10-CM | POA: Diagnosis not present

## 2017-04-06 DIAGNOSIS — K5900 Constipation, unspecified: Secondary | ICD-10-CM | POA: Diagnosis not present

## 2017-04-06 DIAGNOSIS — I10 Essential (primary) hypertension: Secondary | ICD-10-CM | POA: Diagnosis present

## 2017-04-06 DIAGNOSIS — N400 Enlarged prostate without lower urinary tract symptoms: Secondary | ICD-10-CM | POA: Diagnosis not present

## 2017-04-06 DIAGNOSIS — Z7982 Long term (current) use of aspirin: Secondary | ICD-10-CM

## 2017-04-06 DIAGNOSIS — M5459 Other low back pain: Secondary | ICD-10-CM | POA: Diagnosis present

## 2017-04-06 DIAGNOSIS — N401 Enlarged prostate with lower urinary tract symptoms: Secondary | ICD-10-CM | POA: Diagnosis not present

## 2017-04-06 DIAGNOSIS — N419 Inflammatory disease of prostate, unspecified: Principal | ICD-10-CM | POA: Diagnosis present

## 2017-04-06 DIAGNOSIS — Z91041 Radiographic dye allergy status: Secondary | ICD-10-CM

## 2017-04-06 DIAGNOSIS — Z888 Allergy status to other drugs, medicaments and biological substances status: Secondary | ICD-10-CM | POA: Diagnosis not present

## 2017-04-06 DIAGNOSIS — M5136 Other intervertebral disc degeneration, lumbar region: Secondary | ICD-10-CM | POA: Diagnosis not present

## 2017-04-06 DIAGNOSIS — R52 Pain, unspecified: Secondary | ICD-10-CM

## 2017-04-06 DIAGNOSIS — R102 Pelvic and perineal pain: Secondary | ICD-10-CM | POA: Diagnosis not present

## 2017-04-06 DIAGNOSIS — I517 Cardiomegaly: Secondary | ICD-10-CM | POA: Diagnosis not present

## 2017-04-06 DIAGNOSIS — Y929 Unspecified place or not applicable: Secondary | ICD-10-CM | POA: Diagnosis not present

## 2017-04-06 DIAGNOSIS — R1110 Vomiting, unspecified: Secondary | ICD-10-CM | POA: Diagnosis not present

## 2017-04-06 DIAGNOSIS — I2510 Atherosclerotic heart disease of native coronary artery without angina pectoris: Secondary | ICD-10-CM | POA: Diagnosis present

## 2017-04-06 DIAGNOSIS — I455 Other specified heart block: Secondary | ICD-10-CM | POA: Diagnosis not present

## 2017-04-06 DIAGNOSIS — N182 Chronic kidney disease, stage 2 (mild): Secondary | ICD-10-CM | POA: Diagnosis not present

## 2017-04-06 DIAGNOSIS — R509 Fever, unspecified: Secondary | ICD-10-CM | POA: Diagnosis not present

## 2017-04-06 DIAGNOSIS — R3915 Urgency of urination: Secondary | ICD-10-CM | POA: Diagnosis present

## 2017-04-06 DIAGNOSIS — R112 Nausea with vomiting, unspecified: Secondary | ICD-10-CM | POA: Diagnosis not present

## 2017-04-06 DIAGNOSIS — M546 Pain in thoracic spine: Secondary | ICD-10-CM | POA: Diagnosis not present

## 2017-04-06 DIAGNOSIS — E785 Hyperlipidemia, unspecified: Secondary | ICD-10-CM | POA: Diagnosis not present

## 2017-04-06 DIAGNOSIS — M549 Dorsalgia, unspecified: Secondary | ICD-10-CM | POA: Diagnosis not present

## 2017-04-06 DIAGNOSIS — M545 Low back pain: Secondary | ICD-10-CM | POA: Diagnosis not present

## 2017-04-06 DIAGNOSIS — K2970 Gastritis, unspecified, without bleeding: Secondary | ICD-10-CM | POA: Diagnosis not present

## 2017-04-06 DIAGNOSIS — Z951 Presence of aortocoronary bypass graft: Secondary | ICD-10-CM | POA: Diagnosis not present

## 2017-04-06 DIAGNOSIS — I2583 Coronary atherosclerosis due to lipid rich plaque: Secondary | ICD-10-CM | POA: Diagnosis not present

## 2017-04-06 DIAGNOSIS — Y93H1 Activity, digging, shoveling and raking: Secondary | ICD-10-CM

## 2017-04-06 DIAGNOSIS — R6510 Systemic inflammatory response syndrome (SIRS) of non-infectious origin without acute organ dysfunction: Secondary | ICD-10-CM | POA: Diagnosis present

## 2017-04-06 DIAGNOSIS — M48061 Spinal stenosis, lumbar region without neurogenic claudication: Secondary | ICD-10-CM | POA: Diagnosis not present

## 2017-04-06 DIAGNOSIS — M5126 Other intervertebral disc displacement, lumbar region: Secondary | ICD-10-CM | POA: Diagnosis not present

## 2017-04-06 LAB — URINALYSIS, ROUTINE W REFLEX MICROSCOPIC
BILIRUBIN URINE: NEGATIVE
Glucose, UA: NEGATIVE mg/dL
Ketones, ur: NEGATIVE mg/dL
LEUKOCYTES UA: NEGATIVE
NITRITE: NEGATIVE
PROTEIN: NEGATIVE mg/dL
Specific Gravity, Urine: 1.023 (ref 1.005–1.030)
pH: 5 (ref 5.0–8.0)

## 2017-04-06 LAB — CBC WITH DIFFERENTIAL/PLATELET
BASOS ABS: 0 10*3/uL (ref 0.0–0.1)
Basophils Relative: 0 %
EOS PCT: 1 %
Eosinophils Absolute: 0.1 10*3/uL (ref 0.0–0.7)
HEMATOCRIT: 39.1 % (ref 39.0–52.0)
Hemoglobin: 13.2 g/dL (ref 13.0–17.0)
LYMPHS ABS: 1.8 10*3/uL (ref 0.7–4.0)
LYMPHS PCT: 19 %
MCH: 31.6 pg (ref 26.0–34.0)
MCHC: 33.8 g/dL (ref 30.0–36.0)
MCV: 93.5 fL (ref 78.0–100.0)
MONOS PCT: 17 %
Monocytes Absolute: 1.6 10*3/uL — ABNORMAL HIGH (ref 0.1–1.0)
NEUTROS PCT: 63 %
Neutro Abs: 6.1 10*3/uL (ref 1.7–7.7)
Platelets: 154 10*3/uL (ref 150–400)
RBC: 4.18 MIL/uL — AB (ref 4.22–5.81)
RDW: 13.5 % (ref 11.5–15.5)
WBC: 9.6 10*3/uL (ref 4.0–10.5)

## 2017-04-06 LAB — COMPREHENSIVE METABOLIC PANEL
ALT: 17 U/L (ref 17–63)
ANION GAP: 8 (ref 5–15)
AST: 29 U/L (ref 15–41)
Albumin: 3.8 g/dL (ref 3.5–5.0)
Alkaline Phosphatase: 62 U/L (ref 38–126)
BILIRUBIN TOTAL: 1.2 mg/dL (ref 0.3–1.2)
BUN: 22 mg/dL — AB (ref 6–20)
CHLORIDE: 103 mmol/L (ref 101–111)
CO2: 24 mmol/L (ref 22–32)
Calcium: 8.9 mg/dL (ref 8.9–10.3)
Creatinine, Ser: 1.28 mg/dL — ABNORMAL HIGH (ref 0.61–1.24)
GFR, EST AFRICAN AMERICAN: 57 mL/min — AB (ref >60)
GFR, EST NON AFRICAN AMERICAN: 49 mL/min — AB (ref >60)
Glucose, Bld: 114 mg/dL — ABNORMAL HIGH (ref 65–99)
POTASSIUM: 3.9 mmol/L (ref 3.5–5.1)
Sodium: 135 mmol/L (ref 135–145)
TOTAL PROTEIN: 7 g/dL (ref 6.5–8.1)

## 2017-04-06 LAB — I-STAT CG4 LACTIC ACID, ED: LACTIC ACID, VENOUS: 1.12 mmol/L (ref 0.5–1.9)

## 2017-04-06 MED ORDER — HYDROMORPHONE HCL 1 MG/ML IJ SOLN
1.0000 mg | Freq: Once | INTRAMUSCULAR | Status: AC
  Administered 2017-04-06: 1 mg via INTRAVENOUS
  Filled 2017-04-06: qty 1

## 2017-04-06 MED ORDER — IOPAMIDOL (ISOVUE-300) INJECTION 61%
INTRAVENOUS | Status: AC
  Administered 2017-04-06: 80 mL
  Filled 2017-04-06: qty 100

## 2017-04-06 MED ORDER — ONDANSETRON HCL 4 MG/2ML IJ SOLN
4.0000 mg | Freq: Once | INTRAMUSCULAR | Status: AC
  Administered 2017-04-06: 4 mg via INTRAVENOUS
  Filled 2017-04-06: qty 2

## 2017-04-06 MED ORDER — SODIUM CHLORIDE 0.9 % IV BOLUS (SEPSIS)
1000.0000 mL | Freq: Once | INTRAVENOUS | Status: AC
  Administered 2017-04-06: 1000 mL via INTRAVENOUS

## 2017-04-06 MED ORDER — SORBITOL 70 % SOLN
960.0000 mL | TOPICAL_OIL | Freq: Once | ORAL | Status: DC
  Filled 2017-04-06: qty 473

## 2017-04-06 MED ORDER — FENTANYL CITRATE (PF) 100 MCG/2ML IJ SOLN
50.0000 ug | Freq: Once | INTRAMUSCULAR | Status: AC
  Administered 2017-04-06: 50 ug via INTRAVENOUS
  Filled 2017-04-06: qty 2

## 2017-04-06 MED ORDER — DEXTROSE 5 % IV SOLN
1.0000 g | Freq: Once | INTRAVENOUS | Status: AC
  Administered 2017-04-07: 1 g via INTRAVENOUS
  Filled 2017-04-06: qty 10

## 2017-04-06 MED ORDER — MAGNESIUM CITRATE PO SOLN
1.0000 | Freq: Once | ORAL | Status: AC
  Administered 2017-04-06: 1 via ORAL
  Filled 2017-04-06: qty 296

## 2017-04-06 NOTE — ED Notes (Signed)
Patient transported to CT 

## 2017-04-06 NOTE — ED Provider Notes (Signed)
Emergency Department Provider Note   I have reviewed the triage vital signs and the nursing notes.   HISTORY  Chief Complaint Back Pain   HPI Brad Caldwell is a 83 y.o. male hyperlipidemia and coronary artery disease presents the emergency department today with back pain.  Patient states that he was shoveling snow on Friday when that evening he started having severe bilateral lower back pain has gotten any better since that time.  However yesterday he felt really hot to the point he also had nausea and vomiting.  This is new for him.  He is also had urinary urgency and frequency with minimal amount that comes out. Has never had a UTI before.  States the pain is just in his lower back does not seem to radiate anywhere.  He does not have any numbness or weakness in his lower extremities.  No other medical problem.  Has not tried any for the symptoms.  No other associated or modifying symptoms.   Past Medical History:  Diagnosis Date  . Coronary artery disease   . Dyslipidemia   . Hypertension   . Pericarditis    mild postoperative    There are no active problems to display for this patient.   Past Surgical History:  Procedure Laterality Date  . ARTERIOVENOUS GRAFT PLACEMENT W/ ENDOSCOPIC VEIN HARVEST     right leg  . CARDIAC CATHETERIZATION    . CORONARY ARTERY BYPASS GRAFT      Current Outpatient Rx  . Order #: [id number] Class: Historical Med  . Order #: [id number] Class: Historical Med  . Order #: [id number] Class: Historical Med  . Order #: [id number] Class: Historical Med  . Order #: [id number] Class: Historical Med  . Order #: [id number] Class: Historical Med  . Order #: [id number] Class: Historical Med  . Order #: [id number] Class: Historical Med  . Order #: [id number] Class: Historical Med    Allergies Latex; Codeine; and Lisinopril  No family history on file.  Social History Social History   Tobacco Use  . Smoking status: Not on file  Substance Use Topics  .  Alcohol use: Not on file  . Drug use: Not on file    Review of Systems  All other systems negative except as documented in the HPI. All pertinent positives and negatives as reviewed in the HPI. ____________________________________________   PHYSICAL EXAM:  VITAL SIGNS: ED Triage Vitals  Enc Vitals Group     BP 04/06/17 1729 (!) 164/83     Pulse Rate 04/06/17 1729 68     Resp --      Temp 04/06/17 1729 (!) 101.4 F (38.6 C)     Temp Source 04/06/17 1729 Oral     SpO2 04/06/17 1729 94 %     Weight 04/06/17 1731 146 lb (66.2 kg)     Height 04/06/17 1731 5\' 8"  (1.727 m)    Constitutional: Alert and oriented. Well appearing and in no acute distress. Eyes: Conjunctivae are normal. PERRL. EOMI. Head: Atraumatic. Nose: No congestion/rhinnorhea. Mouth/Throat: Mucous membranes are moist.  Oropharynx non-erythematous. Neck: No stridor.  No meningeal signs.   Cardiovascular: Normal rate, regular rhythm. Good peripheral circulation. Grossly normal heart sounds.   Respiratory: Normal respiratory effort.  No retractions. Lungs CTAB. Gastrointestinal: Soft and nontender. No distention.  Musculoskeletal: No lower extremity tenderness nor edema. No gross deformities of extremities.  No obvious midline back tenderness. Neurologic:  Normal speech and language. No gross focal neurologic deficits are appreciated.  GU: enlarged, boggy prostate with mild ttp. Skin:  Skin is hot, dry and intact. No rash noted.  ____________________________________________   LABS (all labs ordered are listed, but only abnormal results are displayed)  Labs Reviewed  COMPREHENSIVE METABOLIC PANEL - Abnormal; Notable for the following components:      Result Value   Glucose, Bld 114 (*)    BUN 22 (*)    Creatinine, Ser 1.28 (*)    GFR calc non Af Amer 49 (*)    GFR calc Af Amer 57 (*)    All other components within normal limits  CBC WITH DIFFERENTIAL/PLATELET - Abnormal; Notable for the following  components:   RBC 4.18 (*)    Monocytes Absolute 1.6 (*)    All other components within normal limits  URINALYSIS, ROUTINE W REFLEX MICROSCOPIC - Abnormal; Notable for the following components:   Hgb urine dipstick MODERATE (*)    Bacteria, UA RARE (*)    Squamous Epithelial / LPF 0-5 (*)    All other components within normal limits  CULTURE, BLOOD (ROUTINE X 2)  CULTURE, BLOOD (ROUTINE X 2)  URINE CULTURE  I-STAT CG4 LACTIC ACID, ED   ____________________________________________  EKG   EKG Interpretation  Date/Time:  Sunday April 06 2017 19:17:39 EST Ventricular Rate:  62 PR Interval:    QRS Duration: 94 QT Interval:  413 QTC Calculation: 420 R Axis:   14 Text Interpretation:  Sinus rhythm Prolonged PR interval Baseline wander in lead(s) V1 No significant change since last tracing Confirmed by Marily MemosMesner, Kaniyah Lisby [phone]([id number]) on 04/06/2017 7:59:28 PM       ____________________________________________  RADIOLOGY  Dg Chest 1 View  Result Date: 04/06/2017 CLINICAL DATA:  Low back pain after shoveling snow a few days ago. EXAM: CHEST 1 VIEW COMPARISON:  February 23, 2009 FINDINGS: The cardiac silhouette appears enlarged. There is a tortuous thoracic aorta. The hila and mediastinum are otherwise normal. The pleura and lungs are normal. No other acute abnormalities. IMPRESSION: The cardiac silhouette appears enlarged which is a new finding since 2010. The finding may be exaggerated due to the AP portable technique. No other abnormalities. Electronically Signed   By: Gerome Samavid  Williams III M.D   On: 04/06/2017 18:40   Dg Pelvis 1-2 Views  Result Date: 04/06/2017 CLINICAL DATA:  Posterior pelvic pain. EXAM: PELVIS - 1-2 VIEW COMPARISON:  None. FINDINGS: The patient is rotated to the right. There is lower lumbar degenerative disc disease of the included lumbar spine from L3 through S1. Associated facet arthropathy is noted. The sacroiliac joints and pubic symphysis appear intact. No acute  fracture nor suspicious osseous lesions. Large amount of stool within the cecum and ascending colon limit fine bony detail of the right iliac bone and sacral ala. Hip joints are maintained bilaterally. Right inguinal clips are noted. IMPRESSION: Lower lumbar degenerative disc and facet arthropathy. No acute pelvic or hip fracture. No joint dislocations. Electronically Signed   By: Tollie Ethavid  Kwon M.D.   On: 04/06/2017 18:43   Ct Abdomen Pelvis W Contrast  Result Date: 04/06/2017 CLINICAL DATA:  Patient has traveled snow, onset of low back pain radiating down right leg. Nausea and vomiting today. Back pain, cancer or infection suspected EXAM: CT ABDOMEN AND PELVIS WITH CONTRAST TECHNIQUE: Multidetector CT imaging of the abdomen and pelvis was performed using the standard protocol following bolus administration of intravenous contrast. CONTRAST:  80mL ISOVUE-300 IOPAMIDOL (ISOVUE-300) INJECTION 61% COMPARISON:  Chest and pelvis radiographs earlier this day. FINDINGS: Lower chest: Bilateral lower lobe atelectasis with trace pleural effusions. Mild cardiomegaly  with coronary artery calcifications, patient is post CABG. No basilar pneumothorax. Hepatobiliary: Streak artifact from arms down positioning, otherwise homogeneous hepatic attenuation. No evidence of focal lesion or traumatic injury. Gallbladder physiologically distended, no calcified stone. No biliary dilatation. Pancreas: No ductal dilatation or inflammation. Spleen: Normal in size.  Splenule at the hilum. Adrenals/Urinary Tract: No adrenal nodule or hemorrhage. No hydronephrosis. Homogeneous renal enhancement with symmetric excretion on delayed phase imaging. Nonobstructing 4 mm stone in the right kidney. Subcentimeter hypodensity in the lower left kidney is too small to characterize but likely cyst. Mild nonspecific bilateral perinephric edema. Urinary bladder is physiologically distended without wall thickening. Stomach/Bowel: Mild distal esophageal wall  thickening. Stomach physiologically distended. Lack of enteric contrast limits detailed bowel assessment. No small bowel obstruction, wall thickening or inflammatory change. Fecalization of distal small bowel contents suggest slow transit. Normal appendix. Cecum, ascending, and transverse colon distended with air and stool. Moderate stool burden in the distended colon. Small volume of stool in the rectosigmoid colon. No colonic inflammation. Vascular/Lymphatic: Moderate aortic atherosclerosis. No aneurysm. No acute vascular abnormality. No enlarged abdominal or pelvic lymph nodes. Reproductive: Heterogeneous enlarged prostate gland. Prostate measures 6.3 x 5.7 x 5.4 cm (volume = 100 cm^3), with mass effect on the bladder base. Other: Laxity of right inguinal canal with minimal fat, no frank hernia. No free air, free fluid, or intra-abdominal fluid collection. Musculoskeletal: Multilevel degenerative change throughout the lumbar spine with diffuse degenerative disc disease and facet arthropathy. No evidence of acute fracture. The bony pelvis is intact. No focal bone lesion. IMPRESSION: 1. Large proximal colonic stool burden with fecalization of distal small bowel contents suggesting slow transit/constipation. No obstruction or bowel inflammation. 2. Heterogeneous enlarged prostate gland. Recommend correlation with PSA. 3. Other than prostatic enlargement which can be seen with prostate cancer, no findings of malignancy or infection in the abdomen or pelvis. 4. Multilevel degenerative change in the lumbar spine without acute fracture. 5.  Aortic Atherosclerosis (ICD10-I70.0). Electronically Signed   By: Rubye OaksMelanie  Ehinger M.D.   On: 04/06/2017 22:30    ____________________________________________   PROCEDURES  Procedure(s) performed:   Procedures   ____________________________________________   INITIAL IMPRESSION / ASSESSMENT AND PLAN / ED COURSE  Suspect UTI possibly early pyelonephritis as the cause  for his symptoms.  This versus less likely be a muscular strain.    CT shows significant prostate enlargement and so rectal exam performed which did show some bogginess and mild tenderness of the prostate.  This could be concern for prostatitis especially with the blood and rare bacteria in his urine.  Start Rocephin cultures added on.  Also found to have a large amount of stool in his proximal colon seems to be backing up and there was intestine could also possibly be related to his pain.  But that these are possible causes patient started 3 doses of pain medicine without significant improvement.  I do not think an enema will help with the constipation based on where it sat so we will start with magnesium citrate however secondary to his difficult to control pain possible prostatitis and temperature of [address] in an 83 year old male I will discuss with medicine about admission.    Pertinent labs & imaging results that were available during my care of the patient were reviewed by me and considered in my medical decision making (see chart for details).  ____________________________________________  FINAL CLINICAL IMPRESSION(S) / ED DIAGNOSES  Final diagnoses:  Prostate enlargement  Acute bilateral back pain, unspecified back location  Fever, unspecified fever cause  Constipation, unspecified constipation type     MEDICATIONS GIVEN DURING THIS VISIT:  Medications  sorbitol, milk of mag, mineral oil, glycerin (SMOG) enema (not administered)  cefTRIAXone (ROCEPHIN) 1 g in dextrose 5 % 50 mL IVPB (not administered)  sodium chloride 0.9 % bolus 1,000 mL (0 mLs Intravenous Stopped 04/06/17 1901)  fentaNYL (SUBLIMAZE) injection 50 mcg (50 mcg Intravenous Given 04/06/17 1804)  iopamidol (ISOVUE-300) 61 % injection (80 mLs  Contrast Given 04/06/17 2056)  fentaNYL (SUBLIMAZE) injection 50 mcg (50 mcg Intravenous Given 04/06/17 2100)  HYDROmorphone (DILAUDID) injection 1 mg (1 mg Intravenous Given  04/06/17 2238)  ondansetron (ZOFRAN) injection 4 mg (4 mg Intravenous Given 04/06/17 2237)  magnesium citrate solution 1 Bottle (1 Bottle Oral Given 04/06/17 2332)     NEW OUTPATIENT MEDICATIONS STARTED DURING THIS VISIT:  This SmartLink is deprecated. Use AVSMEDLIST instead to display the medication list for a patient.  Note:  This note was prepared with assistance of Dragon voice recognition software. Occasional wrong-word or sound-a-like substitutions may have occurred due to the inherent limitations of voice recognition software.   Marily Memos, MD 04/06/17 [phone]

## 2017-04-06 NOTE — ED Notes (Signed)
Patient transported to X-ray 

## 2017-04-06 NOTE — ED Triage Notes (Signed)
Per EMS, pt shoveling snow two days ago and started experience pain to lower back; since then and beginning today pain has increased and is now radiating to RLE. Pt states pain 7/10. Pt states that he is having some nausea. Pt stated that day pain started he was nauseous and "hott" and vomited approx 2 times.

## 2017-04-07 ENCOUNTER — Encounter (HOSPITAL_COMMUNITY): Payer: Self-pay | Admitting: Family Medicine

## 2017-04-07 ENCOUNTER — Observation Stay (HOSPITAL_COMMUNITY): Payer: Medicare Other

## 2017-04-07 ENCOUNTER — Other Ambulatory Visit: Payer: Self-pay

## 2017-04-07 DIAGNOSIS — N182 Chronic kidney disease, stage 2 (mild): Secondary | ICD-10-CM | POA: Diagnosis not present

## 2017-04-07 DIAGNOSIS — I10 Essential (primary) hypertension: Secondary | ICD-10-CM | POA: Diagnosis present

## 2017-04-07 DIAGNOSIS — M545 Low back pain: Secondary | ICD-10-CM

## 2017-04-07 DIAGNOSIS — R509 Fever, unspecified: Secondary | ICD-10-CM | POA: Insufficient documentation

## 2017-04-07 DIAGNOSIS — K5900 Constipation, unspecified: Secondary | ICD-10-CM | POA: Insufficient documentation

## 2017-04-07 DIAGNOSIS — R6510 Systemic inflammatory response syndrome (SIRS) of non-infectious origin without acute organ dysfunction: Secondary | ICD-10-CM | POA: Diagnosis present

## 2017-04-07 DIAGNOSIS — M48061 Spinal stenosis, lumbar region without neurogenic claudication: Secondary | ICD-10-CM | POA: Diagnosis not present

## 2017-04-07 DIAGNOSIS — M5126 Other intervertebral disc displacement, lumbar region: Secondary | ICD-10-CM | POA: Diagnosis not present

## 2017-04-07 DIAGNOSIS — N400 Enlarged prostate without lower urinary tract symptoms: Secondary | ICD-10-CM | POA: Diagnosis present

## 2017-04-07 DIAGNOSIS — M5459 Other low back pain: Secondary | ICD-10-CM | POA: Diagnosis present

## 2017-04-07 DIAGNOSIS — I2583 Coronary atherosclerosis due to lipid rich plaque: Secondary | ICD-10-CM

## 2017-04-07 DIAGNOSIS — I2510 Atherosclerotic heart disease of native coronary artery without angina pectoris: Secondary | ICD-10-CM

## 2017-04-07 LAB — CBC
HEMATOCRIT: 35.6 % — AB (ref 39.0–52.0)
Hemoglobin: 12 g/dL — ABNORMAL LOW (ref 13.0–17.0)
MCH: 31.2 pg (ref 26.0–34.0)
MCHC: 33.7 g/dL (ref 30.0–36.0)
MCV: 92.5 fL (ref 78.0–100.0)
PLATELETS: 148 10*3/uL — AB (ref 150–400)
RBC: 3.85 MIL/uL — AB (ref 4.22–5.81)
RDW: 13.1 % (ref 11.5–15.5)
WBC: 8.4 10*3/uL (ref 4.0–10.5)

## 2017-04-07 LAB — BASIC METABOLIC PANEL
Anion gap: 8 (ref 5–15)
BUN: 21 mg/dL — AB (ref 6–20)
CHLORIDE: 104 mmol/L (ref 101–111)
CO2: 26 mmol/L (ref 22–32)
CREATININE: 1.2 mg/dL (ref 0.61–1.24)
Calcium: 8.7 mg/dL — ABNORMAL LOW (ref 8.9–10.3)
GFR calc non Af Amer: 53 mL/min — ABNORMAL LOW (ref >60)
Glucose, Bld: 108 mg/dL — ABNORMAL HIGH (ref 65–99)
POTASSIUM: 4 mmol/L (ref 3.5–5.1)
Sodium: 138 mmol/L (ref 135–145)

## 2017-04-07 LAB — CK: CK TOTAL: 358 U/L (ref 49–397)

## 2017-04-07 LAB — C-REACTIVE PROTEIN: CRP: 6.1 mg/dL — ABNORMAL HIGH (ref <1.0)

## 2017-04-07 LAB — SEDIMENTATION RATE: Sed Rate: 35 mm/hr — ABNORMAL HIGH (ref 0–16)

## 2017-04-07 MED ORDER — ACETAMINOPHEN 325 MG PO TABS
650.0000 mg | ORAL_TABLET | Freq: Four times a day (QID) | ORAL | Status: DC | PRN
Start: 2017-04-07 — End: 2017-04-09

## 2017-04-07 MED ORDER — ADULT MULTIVITAMIN W/MINERALS CH
1.0000 | ORAL_TABLET | Freq: Every day | ORAL | Status: DC
  Administered 2017-04-07 – 2017-04-09 (×3): 1 via ORAL
  Filled 2017-04-07 (×3): qty 1

## 2017-04-07 MED ORDER — ENOXAPARIN SODIUM 40 MG/0.4ML ~~LOC~~ SOLN
40.0000 mg | SUBCUTANEOUS | Status: DC
  Administered 2017-04-07 – 2017-04-08 (×2): 40 mg via SUBCUTANEOUS
  Filled 2017-04-07 (×2): qty 0.4

## 2017-04-07 MED ORDER — SODIUM CHLORIDE 0.9 % IV SOLN
INTRAVENOUS | Status: AC
  Administered 2017-04-07: 06:00:00 via INTRAVENOUS

## 2017-04-07 MED ORDER — GADOBENATE DIMEGLUMINE 529 MG/ML IV SOLN
13.0000 mL | Freq: Once | INTRAVENOUS | Status: AC
  Administered 2017-04-07: 13 mL via INTRAVENOUS

## 2017-04-07 MED ORDER — HYDROMORPHONE HCL 1 MG/ML IJ SOLN
1.0000 mg | INTRAMUSCULAR | Status: DC | PRN
  Administered 2017-04-07 (×2): 1 mg via INTRAVENOUS
  Filled 2017-04-07 (×2): qty 1

## 2017-04-07 MED ORDER — LORATADINE 10 MG PO TABS: 10.0000 mg | ORAL_TABLET | Freq: Every day | ORAL | Status: DC | PRN

## 2017-04-07 MED ORDER — PAROXETINE HCL 20 MG PO TABS
20.0000 mg | ORAL_TABLET | Freq: Two times a day (BID) | ORAL | Status: DC
  Administered 2017-04-07 – 2017-04-09 (×5): 20 mg via ORAL
  Filled 2017-04-07 (×5): qty 1

## 2017-04-07 MED ORDER — HYDROMORPHONE HCL 1 MG/ML IJ SOLN
1.0000 mg | INTRAMUSCULAR | Status: DC | PRN
  Administered 2017-04-07: 1 mg via INTRAVENOUS
  Filled 2017-04-07: qty 1

## 2017-04-07 MED ORDER — ACETAMINOPHEN 650 MG RE SUPP: 650.0000 mg | Freq: Four times a day (QID) | RECTAL | Status: DC | PRN

## 2017-04-07 MED ORDER — ASPIRIN EC 81 MG PO TBEC
81.0000 mg | DELAYED_RELEASE_TABLET | Freq: Every day | ORAL | Status: DC
  Administered 2017-04-07 – 2017-04-08 (×2): 81 mg via ORAL
  Filled 2017-04-07 (×2): qty 1

## 2017-04-07 MED ORDER — POLYETHYLENE GLYCOL 3350 17 G PO PACK
17.0000 g | PACK | Freq: Every day | ORAL | Status: DC
Start: 2017-04-07 — End: 2017-04-09
  Administered 2017-04-07 – 2017-04-09 (×3): 17 g via ORAL
  Filled 2017-04-07 (×3): qty 1

## 2017-04-07 MED ORDER — ATORVASTATIN CALCIUM 80 MG PO TABS
80.0000 mg | ORAL_TABLET | Freq: Every day | ORAL | Status: DC
  Administered 2017-04-07 – 2017-04-08 (×2): 80 mg via ORAL
  Filled 2017-04-07 (×2): qty 1

## 2017-04-07 MED ORDER — DEXAMETHASONE SODIUM PHOSPHATE 10 MG/ML IJ SOLN
4.0000 mg | Freq: Two times a day (BID) | INTRAMUSCULAR | Status: DC
  Administered 2017-04-07 – 2017-04-08 (×3): 4 mg via INTRAVENOUS
  Filled 2017-04-07 (×3): qty 1

## 2017-04-07 MED ORDER — METHOCARBAMOL 500 MG PO TABS: 500.0000 mg | ORAL_TABLET | Freq: Four times a day (QID) | ORAL | Status: DC | PRN

## 2017-04-07 MED ORDER — HYDROCODONE-ACETAMINOPHEN 5-325 MG PO TABS
1.0000 | ORAL_TABLET | ORAL | Status: DC | PRN
  Administered 2017-04-07 – 2017-04-08 (×3): 2 via ORAL
  Administered 2017-04-09: 1 via ORAL
  Filled 2017-04-07: qty 1
  Filled 2017-04-07 (×3): qty 2

## 2017-04-07 MED ORDER — ONDANSETRON HCL 4 MG PO TABS: 4.0000 mg | ORAL_TABLET | Freq: Four times a day (QID) | ORAL | Status: DC | PRN

## 2017-04-07 MED ORDER — BISACODYL 10 MG RE SUPP
10.0000 mg | Freq: Every day | RECTAL | Status: DC | PRN
  Administered 2017-04-08: 10 mg via RECTAL
  Filled 2017-04-07: qty 1

## 2017-04-07 MED ORDER — METOPROLOL TARTRATE 25 MG PO TABS
25.0000 mg | ORAL_TABLET | Freq: Every day | ORAL | Status: DC
  Administered 2017-04-07 – 2017-04-09 (×3): 25 mg via ORAL
  Filled 2017-04-07 (×3): qty 1

## 2017-04-07 MED ORDER — DEXTROSE 5 % IV SOLN
1.0000 g | INTRAVENOUS | Status: DC
  Administered 2017-04-07 – 2017-04-08 (×2): 1 g via INTRAVENOUS
  Filled 2017-04-07 (×2): qty 10

## 2017-04-07 MED ORDER — LOSARTAN POTASSIUM 50 MG PO TABS
50.0000 mg | ORAL_TABLET | Freq: Every day | ORAL | Status: DC
  Administered 2017-04-07 – 2017-04-09 (×3): 50 mg via ORAL
  Filled 2017-04-07 (×3): qty 1

## 2017-04-07 MED ORDER — CYCLOBENZAPRINE HCL 10 MG PO TABS
5.0000 mg | ORAL_TABLET | Freq: Three times a day (TID) | ORAL | Status: DC | PRN
  Administered 2017-04-07: 5 mg via ORAL
  Filled 2017-04-07: qty 1

## 2017-04-07 MED ORDER — ONDANSETRON HCL 4 MG/2ML IJ SOLN
4.0000 mg | Freq: Four times a day (QID) | INTRAMUSCULAR | Status: DC | PRN
  Administered 2017-04-07: 4 mg via INTRAVENOUS
  Filled 2017-04-07: qty 2

## 2017-04-07 NOTE — Plan of Care (Signed)
  Progressing Education: Knowledge of General Education information will improve 04/07/2017 0943 - Progressing by Darreld Mcleanox, Caleigha Zale, RN Health Behavior/Discharge Planning: Ability to manage health-related needs will improve 04/07/2017 0943 - Progressing by Darreld Mcleanox, Nisreen Guise, RN Clinical Measurements: Ability to maintain clinical measurements within normal limits will improve 04/07/2017 0943 - Progressing by Darreld Mcleanox, Clorissa Gruenberg, RN Will remain free from infection 04/07/2017 0943 - Progressing by Darreld Mcleanox, Dailyn Reith, RN Diagnostic test results will improve 04/07/2017 0943 - Progressing by Darreld Mcleanox, Inara Dike, RN Respiratory complications will improve 04/07/2017 0943 - Progressing by Darreld Mcleanox, Laurey Salser, RN Cardiovascular complication will be avoided 04/07/2017 0943 - Progressing by Darreld Mcleanox, Charmion Hapke, RN Activity: Risk for activity intolerance will decrease 04/07/2017 0943 - Progressing by Darreld Mcleanox, Varetta Chavers, RN Nutrition: Adequate nutrition will be maintained 04/07/2017 0943 - Progressing by Darreld Mcleanox, Idara Woodside, RN Coping: Level of anxiety will decrease 04/07/2017 0943 - Progressing by Darreld Mcleanox, Ysidra Sopher, RN Elimination: Will not experience complications related to bowel motility 04/07/2017 0943 - Progressing by Darreld Mcleanox, Areana Kosanke, RN Will not experience complications related to urinary retention 04/07/2017 0943 - Progressing by Darreld Mcleanox, Lesleyann Fichter, RN Pain Managment: General experience of comfort will improve 04/07/2017 0943 - Progressing by Darreld Mcleanox, Cecely Rengel, RN Safety: Ability to remain free from injury will improve 04/07/2017 0943 - Progressing by Darreld Mcleanox, Calissa Swenor, RN Skin Integrity: Risk for impaired skin integrity will decrease 04/07/2017 0943 - Progressing by Darreld Mcleanox, Amberlin Utke, RN

## 2017-04-07 NOTE — Progress Notes (Signed)
Patient admitted after midnight, please see H&P.  Multiple issues: 1. Fever-- suspect prostatitis, continue rocephin, per ER doc was tender on exam-PSA pending 2. Intractable back pain- IV pain meds, MRI pending  Brad CanaryJessica Anuja Manka DO

## 2017-04-07 NOTE — H&P (Signed)
History and Physical    Brad Caldwell VWU:[id number] DOB: 05/30/1929 DOA: 04/06/2017  PCP: Patient, No Pcp Per   Patient coming from: Home  Chief Complaint: Severe low back pain, nausea, urinary urgency and frequency  HPI: Brad Caldwell is a 83 y.o. male with medical history significant for hypertension and coronary artery disease, now presenting to the emergency department for evaluation of severe low back pain, urinary urgency, and urinary frequency.  Patient reports that he had been in his usual state of fairly good health until approximately 2 days ago when, after shoveling snow, he developed severe pain across the lower back.  He has also been experiencing urinary urgency and urgency, as well as nausea without vomiting since that time.  He denies any history of back pain.  He has not felt feverish.  Denies any abdominal pain or diarrhea.  No cough, shortness of breath, sore throat, or rhinorrhea.  ED Course: Upon arrival to the ED, patient is found to be febrile to 38.6 degree C, saturating adequately on room air, and was stable.  EKG features a sinus rhythm with first-degree AV nodal block.  Chest x-ray is notable for cardiomegaly and radiographs of the low back demonstrate degenerative disc disease without acute fracture.  Chemistry panel is notable for serum creatinine 1.28, consistent with his apparent baseline.  CBC is unremarkable lactic acid is reassuringly normal.  Urinalysis is notable for moderate hemoglobin on the dipstick.  CT of the abdomen and pelvis features a large colonic stool burden without obstruction or inflammation, as well as heterogenously enlarged prostate.  Blood and urine cultures were collected in the ED, an empiric dose of Rocephin was administered, patient was treated with mag citrate, 1 liter NS, fentanyl, Dilaudid, and Zofran in ED. He continues to experience severe low back pain despite this, has remained hemodynamically stable and in no apparent respiratory  distress, and will be observed on med-surg unit for ongoing evaluation and management of intractable low back pain with fever and urinary symptoms.   Review of Systems:  All other systems reviewed and apart from HPI, are negative.  Past Medical History:  Diagnosis Date  . Coronary artery disease   . Dyslipidemia   . Hypertension   . Pericarditis    mild postoperative    Past Surgical History:  Procedure Laterality Date  . ARTERIOVENOUS GRAFT PLACEMENT W/ ENDOSCOPIC VEIN HARVEST     right leg  . CARDIAC CATHETERIZATION    . CORONARY ARTERY BYPASS GRAFT       has no tobacco, alcohol, and drug history on file.  Allergies  Allergen Reactions  . Latex Other (See Comments)    REACTION: respiratory arrest  . Codeine Nausea And Vomiting  . Lisinopril Other (See Comments)    Unknown reaction    History reviewed. No pertinent family history.   Prior to Admission medications   Medication Sig Start Date End Date Taking? Authorizing Provider  aspirin EC 81 MG tablet Take 81 mg by mouth at bedtime.   Yes [provider]  atorvastatin (LIPITOR) 80 MG tablet Take 80 mg by mouth at bedtime.    Yes [provider]  loratadine (CLARITIN) 10 MG tablet Take 10 mg by mouth daily as needed for allergies.   Yes [provider]  losartan (COZAAR) 100 MG tablet Take 50 mg by mouth daily.   Yes [provider]  metoprolol tartrate (LOPRESSOR) 50 MG tablet Take 25 mg by mouth daily.   Yes [provider]  Multiple Vitamin (MULTIVITAMIN WITH MINERALS) TABS tablet Take 1 tablet by mouth daily.   Yes [provider]  PARoxetine (PAXIL) 40 MG tablet Take 20 mg by mouth 2 (two) times daily. For anxiety/nerves   Yes [provider]  Pramoxine-Dimethicone (GOLD BOND INTENSIVE HEALING) 1-6 % CREA Apply 1 application topically daily as needed (rash on legs).   Yes [provider]  propranolol (INDERAL) 80 MG tablet Take 80 mg by mouth  daily.   Yes [provider]    Physical Exam: Vitals:   04/06/17 2215 04/06/17 2315 04/06/17 2345 04/07/17 0000  BP: (!) 160/81 (!) 165/66 (!) 165/72 (!) 182/74  Pulse: (!) 52 (!) 56 (!) 52 (!) 55  Resp: 13 12 14 14   Temp:      TempSrc:      SpO2: 95% 90% 94% 92%  Weight:      Height:          Constitutional: NAD, calm, in obvious discomfort Eyes: PERTLA, lids and conjunctivae normal ENMT: Mucous membranes are moist. Posterior pharynx clear of any exudate or lesions.   Neck: normal, supple, no masses, no thyromegaly Respiratory: clear to auscultation bilaterally, no wheezing, no crackles. Normal respiratory effort.   Cardiovascular: S1 & S2 heard, regular rate and rhythm. No extremity edema. No significant JVD. Abdomen: No distension, no tenderness, no masses palpated. Bowel sounds normal.  Musculoskeletal: no clubbing / cyanosis. No joint deformity upper and lower extremities.   Skin: no significant rashes, lesions, ulcers. Warm, dry, well-perfused. Neurologic: CN 2-12 grossly intact. Sensation intact. Strength 5/5 in all 4 limbs.  Psychiatric: Alert and oriented x 3. Pleasant, cooperative.     Labs on Admission: I have personally reviewed following labs and imaging studies  CBC: Recent Labs  Lab 04/06/17 1753  WBC 9.6  NEUTROABS 6.1  HGB 13.2  HCT 39.1  MCV 93.5  PLT 154   Basic Metabolic Panel: Recent Labs  Lab 04/06/17 1753  NA 135  K 3.9  CL 103  CO2 24  GLUCOSE 114*  BUN 22*  CREATININE 1.28*  CALCIUM 8.9   GFR: Estimated Creatinine Clearance: 39.5 mL/min (A) (by C-G formula based on SCr of 1.28 mg/dL (H)). Liver Function Tests: Recent Labs  Lab 04/06/17 1753  AST 29  ALT 17  ALKPHOS 62  BILITOT 1.2  PROT 7.0  ALBUMIN 3.8   No results for input(s): LIPASE, AMYLASE in the last 168 hours. No results for input(s): AMMONIA in the last 168 hours. Coagulation Profile: No results for input(s): INR, PROTIME in the last 168  hours. Cardiac Enzymes: No results for input(s): CKTOTAL, CKMB, CKMBINDEX, TROPONINI in the last 168 hours. BNP (last 3 results) No results for input(s): PROBNP in the last 8760 hours. HbA1C: No results for input(s): HGBA1C in the last 72 hours. CBG: No results for input(s): GLUCAP in the last 168 hours. Lipid Profile: No results for input(s): CHOL, HDL, LDLCALC, TRIG, CHOLHDL, LDLDIRECT in the last 72 hours. Thyroid Function Tests: No results for input(s): TSH, T4TOTAL, FREET4, T3FREE, THYROIDAB in the last 72 hours. Anemia Panel: No results for input(s): VITAMINB12, FOLATE, FERRITIN, TIBC, IRON, RETICCTPCT in the last 72 hours. Urine analysis:    Component Value Date/Time   COLORURINE YELLOW 04/06/2017 1753   APPEARANCEUR CLEAR 04/06/2017 1753   LABSPEC 1.023 04/06/2017 1753   PHURINE 5.0 04/06/2017 1753   GLUCOSEU NEGATIVE 04/06/2017 1753   HGBUR MODERATE (A) 04/06/2017 1753   BILIRUBINUR NEGATIVE 04/06/2017 1753  KETONESUR NEGATIVE 04/06/2017 1753   PROTEINUR NEGATIVE 04/06/2017 1753   UROBILINOGEN 0.2 01/12/2009 2113   NITRITE NEGATIVE 04/06/2017 1753   LEUKOCYTESUR NEGATIVE 04/06/2017 1753   Sepsis Labs: @LABRCNTIP (procalcitonin:4,lacticidven:4) )No results found for this or any previous visit (from the past 240 hour(s)).   Radiological Exams on Admission: Dg Chest 1 View  Result Date: 04/06/2017 CLINICAL DATA:  Low back pain after shoveling snow a few days ago. EXAM: CHEST 1 VIEW COMPARISON:  February 23, 2009 FINDINGS: The cardiac silhouette appears enlarged. There is a tortuous thoracic aorta. The hila and mediastinum are otherwise normal. The pleura and lungs are normal. No other acute abnormalities. IMPRESSION: The cardiac silhouette appears enlarged which is a new finding since 2010. The finding may be exaggerated due to the AP portable technique. No other abnormalities. Electronically Signed   By: Gerome Samavid  Williams III M.D   On: 04/06/2017 18:40   Dg Pelvis 1-2  Views  Result Date: 04/06/2017 CLINICAL DATA:  Posterior pelvic pain. EXAM: PELVIS - 1-2 VIEW COMPARISON:  None. FINDINGS: The patient is rotated to the right. There is lower lumbar degenerative disc disease of the included lumbar spine from L3 through S1. Associated facet arthropathy is noted. The sacroiliac joints and pubic symphysis appear intact. No acute fracture nor suspicious osseous lesions. Large amount of stool within the cecum and ascending colon limit fine bony detail of the right iliac bone and sacral ala. Hip joints are maintained bilaterally. Right inguinal clips are noted. IMPRESSION: Lower lumbar degenerative disc and facet arthropathy. No acute pelvic or hip fracture. No joint dislocations. Electronically Signed   By: Tollie Ethavid  Kwon M.D.   On: 04/06/2017 18:43   Ct Abdomen Pelvis W Contrast  Result Date: 04/06/2017 CLINICAL DATA:  Patient has traveled snow, onset of low back pain radiating down right leg. Nausea and vomiting today. Back pain, cancer or infection suspected EXAM: CT ABDOMEN AND PELVIS WITH CONTRAST TECHNIQUE: Multidetector CT imaging of the abdomen and pelvis was performed using the standard protocol following bolus administration of intravenous contrast. CONTRAST:  80mL ISOVUE-300 IOPAMIDOL (ISOVUE-300) INJECTION 61% COMPARISON:  Chest and pelvis radiographs earlier this day. FINDINGS: Lower chest: Bilateral lower lobe atelectasis with trace pleural effusions. Mild cardiomegaly with coronary artery calcifications, patient is post CABG. No basilar pneumothorax. Hepatobiliary: Streak artifact from arms down positioning, otherwise homogeneous hepatic attenuation. No evidence of focal lesion or traumatic injury. Gallbladder physiologically distended, no calcified stone. No biliary dilatation. Pancreas: No ductal dilatation or inflammation. Spleen: Normal in size.  Splenule at the hilum. Adrenals/Urinary Tract: No adrenal nodule or hemorrhage. No hydronephrosis. Homogeneous renal  enhancement with symmetric excretion on delayed phase imaging. Nonobstructing 4 mm stone in the right kidney. Subcentimeter hypodensity in the lower left kidney is too small to characterize but likely cyst. Mild nonspecific bilateral perinephric edema. Urinary bladder is physiologically distended without wall thickening. Stomach/Bowel: Mild distal esophageal wall thickening. Stomach physiologically distended. Lack of enteric contrast limits detailed bowel assessment. No small bowel obstruction, wall thickening or inflammatory change. Fecalization of distal small bowel contents suggest slow transit. Normal appendix. Cecum, ascending, and transverse colon distended with air and stool. Moderate stool burden in the distended colon. Small volume of stool in the rectosigmoid colon. No colonic inflammation. Vascular/Lymphatic: Moderate aortic atherosclerosis. No aneurysm. No acute vascular abnormality. No enlarged abdominal or pelvic lymph nodes. Reproductive: Heterogeneous enlarged prostate gland. Prostate measures 6.3 x 5.7 x 5.4 cm (volume = 100 cm^3), with mass effect on the bladder base. Other: Laxity of  right inguinal canal with minimal fat, no frank hernia. No free air, free fluid, or intra-abdominal fluid collection. Musculoskeletal: Multilevel degenerative change throughout the lumbar spine with diffuse degenerative disc disease and facet arthropathy. No evidence of acute fracture. The bony pelvis is intact. No focal bone lesion. IMPRESSION: 1. Large proximal colonic stool burden with fecalization of distal small bowel contents suggesting slow transit/constipation. No obstruction or bowel inflammation. 2. Heterogeneous enlarged prostate gland. Recommend correlation with PSA. 3. Other than prostatic enlargement which can be seen with prostate cancer, no findings of malignancy or infection in the abdomen or pelvis. 4. Multilevel degenerative change in the lumbar spine without acute fracture. 5.  Aortic  Atherosclerosis (ICD10-I70.0). Electronically Signed   By: Rubye Oaks M.D.   On: 04/06/2017 22:30    EKG: Independently reviewed. Sinus rhythm, 1st degree AV block.   Assessment/Plan  1. Intractable low back pain  - Pt presents with severe low back pain and urinary sxs, found to be febrile  - CT abd/pelvis negative for acute lumbar spine pathology, notable for degenerative changes involving L-spine, as well as heterogenous prostate enlargement and large stool-burden  - Possibly musculoskeletal given onset after shoveling snow; infectious etiology considered given fever and urinary sxs  - Continues to be in severe pain despite doses of fentanyl and Dilaudid in ED; also treated with mag citrate, enema, and Rocephin in ED   - Plan to continue empiric abx, continue analgesia, bowel-regimen, supportive care  2. Fever - Pt presents with LBP and urinary sxs, noted to be febrile  - No leukocytosis present, lactate is normal, and urinalysis without pyuria, leukocytes, or nitrites  - CXR clear and only complaints are urinary and LBP  - Blood and urine cultures collected in ED and empiric Rocephin administered  - Plan to continue Rocephin pending cultures and clinical course    3. Enlarged prostate  - Pt reports urinary urgency and frequency, noted to have microscopic hematuria and heterogenous prostate enlargement on CT  - He is being treated empirically for possible prostatitis as above  - No adenopathy or bony lesions noted on CT abd/pelvis   - Check PSA, follow urine culture, continue empiric abx for now    4. Hypertension  - BP at goal  - Continue losartan and Lopressor    5. CAD - No anginal complaints  - Continue ASA, statin, ARB, beta-blocker    6. CKD stage II  - SCr is stable at 1.28 on admission - Renally-dose medications, avoid nephrotoxins where possible    DVT prophylaxis: Lovenox  Code Status: Full  Family Communication: Son updated at bedside Disposition Plan:  Observe on med-surg Consults called: None Admission status: Observation    Briscoe Deutscher, MD [facility] Pager [phone]  If 7PM-7AM, please contact night-coverage [email or web address] Password [email or web address]  04/07/2017, 12:10 AM

## 2017-04-08 DIAGNOSIS — M5136 Other intervertebral disc degeneration, lumbar region: Secondary | ICD-10-CM | POA: Diagnosis present

## 2017-04-08 DIAGNOSIS — N419 Inflammatory disease of prostate, unspecified: Secondary | ICD-10-CM | POA: Diagnosis present

## 2017-04-08 DIAGNOSIS — Y929 Unspecified place or not applicable: Secondary | ICD-10-CM | POA: Diagnosis not present

## 2017-04-08 DIAGNOSIS — N401 Enlarged prostate with lower urinary tract symptoms: Secondary | ICD-10-CM | POA: Diagnosis present

## 2017-04-08 DIAGNOSIS — Z888 Allergy status to other drugs, medicaments and biological substances status: Secondary | ICD-10-CM | POA: Diagnosis not present

## 2017-04-08 DIAGNOSIS — E785 Hyperlipidemia, unspecified: Secondary | ICD-10-CM | POA: Diagnosis present

## 2017-04-08 DIAGNOSIS — N182 Chronic kidney disease, stage 2 (mild): Secondary | ICD-10-CM | POA: Diagnosis not present

## 2017-04-08 DIAGNOSIS — Z79899 Other long term (current) drug therapy: Secondary | ICD-10-CM | POA: Diagnosis not present

## 2017-04-08 DIAGNOSIS — K5900 Constipation, unspecified: Secondary | ICD-10-CM | POA: Diagnosis not present

## 2017-04-08 DIAGNOSIS — Z951 Presence of aortocoronary bypass graft: Secondary | ICD-10-CM | POA: Diagnosis not present

## 2017-04-08 DIAGNOSIS — R509 Fever, unspecified: Secondary | ICD-10-CM | POA: Diagnosis present

## 2017-04-08 DIAGNOSIS — I2510 Atherosclerotic heart disease of native coronary artery without angina pectoris: Secondary | ICD-10-CM | POA: Diagnosis present

## 2017-04-08 DIAGNOSIS — N400 Enlarged prostate without lower urinary tract symptoms: Secondary | ICD-10-CM | POA: Diagnosis not present

## 2017-04-08 DIAGNOSIS — Z91041 Radiographic dye allergy status: Secondary | ICD-10-CM | POA: Diagnosis not present

## 2017-04-08 DIAGNOSIS — R52 Pain, unspecified: Secondary | ICD-10-CM | POA: Diagnosis present

## 2017-04-08 DIAGNOSIS — I10 Essential (primary) hypertension: Secondary | ICD-10-CM | POA: Diagnosis present

## 2017-04-08 DIAGNOSIS — Z7982 Long term (current) use of aspirin: Secondary | ICD-10-CM | POA: Diagnosis not present

## 2017-04-08 DIAGNOSIS — M545 Low back pain: Secondary | ICD-10-CM | POA: Diagnosis not present

## 2017-04-08 DIAGNOSIS — Y93H1 Activity, digging, shoveling and raking: Secondary | ICD-10-CM | POA: Diagnosis not present

## 2017-04-08 DIAGNOSIS — R3915 Urgency of urination: Secondary | ICD-10-CM | POA: Diagnosis present

## 2017-04-08 DIAGNOSIS — I455 Other specified heart block: Secondary | ICD-10-CM | POA: Diagnosis present

## 2017-04-08 DIAGNOSIS — I517 Cardiomegaly: Secondary | ICD-10-CM | POA: Diagnosis present

## 2017-04-08 LAB — PSA, TOTAL AND FREE
PROSTATE SPECIFIC AG, SERUM: 3.3 ng/mL (ref 0.0–4.0)
PSA FREE PCT: 34.5 %
PSA FREE: 1.14 ng/mL

## 2017-04-08 LAB — URINE CULTURE

## 2017-04-08 MED ORDER — FLEET ENEMA 7-19 GM/118ML RE ENEM: 1.0000 | ENEMA | Freq: Every day | RECTAL | Status: DC | PRN

## 2017-04-08 NOTE — Evaluation (Signed)
Physical Therapy Evaluation Patient Details Name: Brad BurrowCharles E Norell MRN: [id number] DOB: July 03, 1929 Today's Date: 04/08/2017   History of Present Illness  Pt is an 83 y/o male admitted secondary to intractable back pain after shoveling snow. MRI revealed multilevel spondylosis, however, no acute abnormality. CT of the abdomen revealed enlarged prostate. PMH includes CHD, HTN, CAD s/p CABG, and pericarditis.   Clinical Impression  Pt admitted secondary to problem above with deficits below. PTA, pt was independent with functional mobility. Upon eval, pt presenting with back pain (increased with movement), decreased balance, and generalized weakness. Pt requiring min to mod A with mobility this session. Educated about generalized back precautions to help with decreasing back pain. Pt reported improvement in stiffness and back pain with gait. Reports wife will be able to assist at d/c and has all necessary DME. Recommending HHPT at d/c to address mobility deficits. Will continue to follow acutely to maximize functional mobility independence and safety.     Follow Up Recommendations Home health PT;Supervision/Assistance - 24 hour    Equipment Recommendations  None recommended by PT    Recommendations for Other Services OT consult     Precautions / Restrictions Precautions Precautions: Fall Restrictions Weight Bearing Restrictions: No      Mobility  Bed Mobility Overal bed mobility: Needs Assistance Bed Mobility: Rolling;Sidelying to Sit;Sit to Sidelying Rolling: Min assist Sidelying to sit: Mod assist     Sit to sidelying: Min assist General bed mobility comments: Pt very guarded. Educated about use of log roll technique to decrease strain on back. Min A for rolling, and mod A for trunk elevation. Min A for LE lift assist to return to sidelying.   Transfers Overall transfer level: Needs assistance Equipment used: Rolling walker (2 wheeled) Transfers: Sit to/from Stand Sit to Stand:  Min assist;Min guard         General transfer comment: Practiced sit<>Stand X 2. Initially requried min A, however, able to progress to min guard assist with second attempt. Verbal cues for safe hand placement.   Ambulation/Gait Ambulation/Gait assistance: Min assist Ambulation Distance (Feet): 20 Feet Assistive device: Rolling walker (2 wheeled) Gait Pattern/deviations: Step-through pattern;Decreased stride length Gait velocity: Decreased  Gait velocity interpretation: Below normal speed for age/gender General Gait Details: Slow, very guarded gait. Initially shaky during gait, however, improved with increased distance. Min A for steadying with use of RW. Pt reports improvement in back pain with ambulation. Educated about generalized walking program to perform at home. Educated about use of RW at home.   Stairs            Wheelchair Mobility    Modified Rankin (Stroke Patients Only)       Balance Overall balance assessment: Needs assistance Sitting-balance support: Bilateral upper extremity supported;Feet supported Sitting balance-Leahy Scale: Fair Sitting balance - Comments: LOB noted with dynamic tasks. Required at least one UE support.    Standing balance support: Bilateral upper extremity supported;During functional activity Standing balance-Leahy Scale: Poor Standing balance comment: Reliant on BUE support                              Pertinent Vitals/Pain Pain Assessment: 0-10 Pain Score: 2  Pain Location: back  Pain Descriptors / Indicators: Aching;Grimacing;Guarding Pain Intervention(s): Limited activity within patient's tolerance;Monitored during session;Repositioned    Home Living Family/patient expects to be discharged to:: Private residence Living Arrangements: Spouse/significant other Available Help at Discharge: Family;Available 24 hours/day Type of Home:  House Home Access: Stairs to enter Entrance Stairs-Rails: Right Entrance  Stairs-Number of Steps: 2 Home Layout: One level Home Equipment: Walker - 2 wheels;Bedside commode      Prior Function Level of Independence: Independent               Hand Dominance   Dominant Hand: Right    Extremity/Trunk Assessment   Upper Extremity Assessment Upper Extremity Assessment: Defer to OT evaluation    Lower Extremity Assessment Lower Extremity Assessment: Generalized weakness    Cervical / Trunk Assessment Cervical / Trunk Assessment: Kyphotic  Communication   Communication: No difficulties  Cognition Arousal/Alertness: Awake/alert Behavior During Therapy: WFL for tasks assessed/performed Overall Cognitive Status: Within Functional Limits for tasks assessed                                        General Comments General comments (skin integrity, edema, etc.): Pt's son and wife present during session. Educated about HHPT for d/c and pt agreeable.     Exercises Other Exercises Other Exercises: shoulder rolls X 10 to help with stiffness.  Other Exercises: Arm raises BUE X 15.    Assessment/Plan    PT Assessment Patient needs continued PT services  PT Problem List Decreased strength;Decreased balance;Decreased mobility;Decreased knowledge of use of DME;Pain       PT Treatment Interventions DME instruction;Gait training;Stair training;Functional mobility training;Therapeutic activities;Therapeutic exercise;Balance training;Neuromuscular re-education;Patient/family education    PT Goals (Current goals can be found in the Care Plan section)  Acute Rehab PT Goals Patient Stated Goal: to move like I was before  PT Goal Formulation: With patient Time For Goal Achievement: 04/22/17 Potential to Achieve Goals: Good    Frequency Min 3X/week   Barriers to discharge        Co-evaluation               AM-PAC PT "6 Clicks" Daily Activity  Outcome Measure Difficulty turning over in bed (including adjusting bedclothes, sheets  and blankets)?: Unable Difficulty moving from lying on back to sitting on the side of the bed? : Unable Difficulty sitting down on and standing up from a chair with arms (e.g., wheelchair, bedside commode, etc,.)?: Unable Help needed moving to and from a bed to chair (including a wheelchair)?: A Little Help needed walking in hospital room?: A Little Help needed climbing 3-5 steps with a railing? : A Lot 6 Click Score: 11    End of Session Equipment Utilized During Treatment: Gait belt Activity Tolerance: Patient tolerated treatment well Patient left: in bed;with call bell/phone within reach;with bed alarm set;with family/visitor present Nurse Communication: Mobility status PT Visit Diagnosis: Unsteadiness on feet (R26.81);Other abnormalities of gait and mobility (R26.89);Pain Pain - part of body: (back )    Time: 1610-9604 PT Time Calculation (min) (ACUTE ONLY): 23 min   Charges:   PT Evaluation $PT Eval Moderate Complexity: 1 Mod PT Treatments $Gait Training: 8-22 mins   PT G Codes:   PT G-Codes **NOT FOR INPATIENT CLASS** Functional Assessment Tool Used: AM-PAC 6 Clicks Basic Mobility;Clinical judgement Functional Limitation: Mobility: Walking and moving around Mobility: Walking and Moving Around Current Status ([id number]): At least 60 percent but less than 80 percent impaired, limited or restricted Mobility: Walking and Moving Around Goal Status [phone]): At least 20 percent but less than 40 percent impaired, limited or restricted    Gladys Damme, PT, DPT  Acute Rehabilitation  Services  Pager: [phone]   Lehman PromBrittany S Tayler Heiden 04/08/2017, 11:27 AM

## 2017-04-08 NOTE — Progress Notes (Signed)
PROGRESS NOTE    Brad Caldwell  ZOX:[id number] DOB: 12-23-1929 DOA: 04/06/2017 PCP: Patient, No Pcp Per   Outpatient Specialists:     Brief Narrative:  Brad BurrowCharles E Hinderliter is a 83 y.o. male with medical history significant for hypertension and coronary artery disease, now presenting to the emergency department for evaluation of severe low back pain, urinary urgency, and urinary frequency.  Patient reports that he had been in his usual state of fairly good health until approximately 2 days ago when, after shoveling snow, he developed severe pain across the lower back.  He has also been experiencing urinary urgency and urgency, as well as nausea without vomiting since that time.  He denies any history of back pain.  He has not felt feverish.  Denies any abdominal pain or diarrhea.  No cough, shortness of breath, sore throat, or rhinorrhea.     Assessment & Plan:   Principal Problem:   Intractable low back pain Active Problems:   SIRS (systemic inflammatory response syndrome) (HCC)   Enlarged prostate   Coronary artery disease   Essential hypertension   CKD (chronic kidney disease), stage II   Intractable low back pain  - Possibly musculoskeletal given onset after shoveling snow - muscle relaxers, PO pain control -PT- home health -ambulate q shift  Constipation -miralax -enema  Fever due to prostatitis -cultures pending -improved -IV rocephin  Enlarged prostate  - PSA done and normal   Hypertension  - BP at goal  - Continue losartan and Lopressor    CAD - No anginal complaints  - Continue ASA, statin, ARB, beta-blocker    CKD stage II  - SCr is stable at 1.28 on admission - Renally-dose medications, avoid nephrotoxins where possible      DVT prophylaxis:  Lovenox   Code Status: Full Code   Family Communication:   Disposition Plan:     Consultants:        Subjective: No BM since coming to hospital  Objective: Vitals:   04/07/17  1328 04/07/17 1900 04/08/17 0500 04/08/17 1300  BP: (!) 136/56 (!) 179/61 (!) 180/76 (!) 157/65  Pulse: (!) 50 66 65 (!) 52  Resp: 16 16 16 16   Temp: 98.9 F (37.2 C) 98.6 F (37 C) 98.3 F (36.8 C) 98.1 F (36.7 C)  TempSrc: Oral Oral Oral Oral  SpO2: 94% 93% 96% 96%  Weight:      Height:        Intake/Output Summary (Last 24 hours) at 04/08/2017 1436 Last data filed at 04/08/2017 1300 Gross per 24 hour  Intake 720 ml  Output 600 ml  Net 120 ml   Filed Weights   04/06/17 1731 04/07/17 0151  Weight: 66.2 kg (146 lb) 66.2 kg (145 lb 15.1 oz)    Examination:  General exam: Appears calm and comfortable  Respiratory system: Clear to auscultation. Respiratory effort normal. Cardiovascular system: S1 & S2 heard, RRR. No JVD, murmurs, rubs, gallops or clicks. No pedal edema. Gastrointestinal system: Abdomen is nondistended, soft and nontender. No organomegaly or masses felt. Normal bowel sounds heard. Central nervous system: Alert and oriented. No focal neurological deficits. Extremities: Symmetric 5 x 5 power. Skin: No rashes, lesions or ulcers Psychiatry: Judgement and insight appear normal. Mood & affect appropriate.     Data Reviewed: I have personally reviewed following labs and imaging studies  CBC: Recent Labs  Lab 04/06/17 1753 04/07/17 0609  WBC 9.6 8.4  NEUTROABS 6.1  --   HGB 13.2 12.0*  HCT 39.1 35.6*  MCV 93.5 92.5  PLT 154 148*   Basic Metabolic Panel: Recent Labs  Lab 04/06/17 1753 04/07/17 0609  NA 135 138  K 3.9 4.0  CL 103 104  CO2 24 26  GLUCOSE 114* 108*  BUN 22* 21*  CREATININE 1.28* 1.20  CALCIUM 8.9 8.7*   GFR: Estimated Creatinine Clearance: 42.1 mL/min (by C-G formula based on SCr of 1.2 mg/dL). Liver Function Tests: Recent Labs  Lab 04/06/17 1753  AST 29  ALT 17  ALKPHOS 62  BILITOT 1.2  PROT 7.0  ALBUMIN 3.8   No results for input(s): LIPASE, AMYLASE in the last 168 hours. No results for input(s): AMMONIA in the  last 168 hours. Coagulation Profile: No results for input(s): INR, PROTIME in the last 168 hours. Cardiac Enzymes: Recent Labs  Lab 04/07/17 1239  CKTOTAL 358   BNP (last 3 results) No results for input(s): PROBNP in the last 8760 hours. HbA1C: No results for input(s): HGBA1C in the last 72 hours. CBG: No results for input(s): GLUCAP in the last 168 hours. Lipid Profile: No results for input(s): CHOL, HDL, LDLCALC, TRIG, CHOLHDL, LDLDIRECT in the last 72 hours. Thyroid Function Tests: No results for input(s): TSH, T4TOTAL, FREET4, T3FREE, THYROIDAB in the last 72 hours. Anemia Panel: No results for input(s): VITAMINB12, FOLATE, FERRITIN, TIBC, IRON, RETICCTPCT in the last 72 hours. Urine analysis:    Component Value Date/Time   COLORURINE YELLOW 04/06/2017 1753   APPEARANCEUR CLEAR 04/06/2017 1753   LABSPEC 1.023 04/06/2017 1753   PHURINE 5.0 04/06/2017 1753   GLUCOSEU NEGATIVE 04/06/2017 1753   HGBUR MODERATE (A) 04/06/2017 1753   BILIRUBINUR NEGATIVE 04/06/2017 1753   KETONESUR NEGATIVE 04/06/2017 1753   PROTEINUR NEGATIVE 04/06/2017 1753   UROBILINOGEN 0.2 01/12/2009 2113   NITRITE NEGATIVE 04/06/2017 1753   LEUKOCYTESUR NEGATIVE 04/06/2017 1753     ) Recent Results (from the past 240 hour(s))  Blood Culture (routine x 2)     Status: None (Preliminary result)   Collection Time: 04/06/17  7:02 PM  Result Value Ref Range Status   Specimen Description BLOOD LEFT ANTECUBITAL  Final   Special Requests   Final    BOTTLES DRAWN AEROBIC AND ANAEROBIC Blood Culture adequate volume   Culture NO GROWTH < 24 HOURS  Final   Report Status PENDING  Incomplete  Blood Culture (routine x 2)     Status: None (Preliminary result)   Collection Time: 04/06/17  7:12 PM  Result Value Ref Range Status   Specimen Description BLOOD LEFT ANTECUBITAL  Final   Special Requests   Final    BOTTLES DRAWN AEROBIC AND ANAEROBIC Blood Culture adequate volume   Culture NO GROWTH < 24 HOURS   Final   Report Status PENDING  Incomplete      Anti-infectives (From admission, onward)   Start     Dose/Rate Route Frequency Ordered Stop   04/07/17 2200  cefTRIAXone (ROCEPHIN) 1 g in dextrose 5 % 50 mL IVPB     1 g 100 mL/hr over 30 Minutes Intravenous Every 24 hours 04/07/17 0027     04/06/17 2345  cefTRIAXone (ROCEPHIN) 1 g in dextrose 5 % 50 mL IVPB     1 g 100 mL/hr over 30 Minutes Intravenous  Once 04/06/17 2331 04/07/17 0055       Radiology Studies: Dg Chest 1 View  Result Date: 04/06/2017 CLINICAL DATA:  Low back pain after shoveling snow a few days ago. EXAM: CHEST 1 VIEW  COMPARISON:  February 23, 2009 FINDINGS: The cardiac silhouette appears enlarged. There is a tortuous thoracic aorta. The hila and mediastinum are otherwise normal. The pleura and lungs are normal. No other acute abnormalities. IMPRESSION: The cardiac silhouette appears enlarged which is a new finding since 2010. The finding may be exaggerated due to the AP portable technique. No other abnormalities. Electronically Signed   By: Gerome Samavid  Williams III M.D   On: 04/06/2017 18:40   Dg Pelvis 1-2 Views  Result Date: 04/06/2017 CLINICAL DATA:  Posterior pelvic pain. EXAM: PELVIS - 1-2 VIEW COMPARISON:  None. FINDINGS: The patient is rotated to the right. There is lower lumbar degenerative disc disease of the included lumbar spine from L3 through S1. Associated facet arthropathy is noted. The sacroiliac joints and pubic symphysis appear intact. No acute fracture nor suspicious osseous lesions. Large amount of stool within the cecum and ascending colon limit fine bony detail of the right iliac bone and sacral ala. Hip joints are maintained bilaterally. Right inguinal clips are noted. IMPRESSION: Lower lumbar degenerative disc and facet arthropathy. No acute pelvic or hip fracture. No joint dislocations. Electronically Signed   By: Tollie Ethavid  Kwon M.D.   On: 04/06/2017 18:43   Mr Lumbar Spine W Wo Contrast  Result Date:  04/07/2017 CLINICAL DATA:  Back pain for less than 6 weeks. Chronic kidney disease. History of hypertension. EXAM: MRI LUMBAR SPINE WITHOUT AND WITH CONTRAST TECHNIQUE: Multiplanar and multiecho pulse sequences of the lumbar spine were obtained without and with intravenous contrast. CONTRAST:  13mL MULTIHANCE GADOBENATE DIMEGLUMINE 529 MG/ML IV SOLN COMPARISON:  Abdominopelvic CT 04/06/2017. FINDINGS: Segmentation: Conventional anatomy assumed, with the last open disc space designated L5-S1. This is concordant with recent abdominal CT. Alignment: Mild convex right scoliosis. Near anatomic lateral alignment. Vertebrae: There are multilevel endplate degenerative changes. These are most advanced at L1-2 where there are Modic 2 changes and at L5-S1 where there are Modic 1 changes asymmetric to the right. There is associated endplate enhancement at L5-S1, but no bone destruction, suspicious disc signal or abnormal disc enhancement. Probable scattered hemangiomas, largest in the right aspect of the L2 vertebral body. No evidence of acute fracture or pars defect. The visualized sacroiliac joints appear unremarkable. Conus medullaris: Extends to the L1-2 level and appears normal. No abnormal intradural enhancement. Paraspinal and other soft tissues: No evidence of paraspinal inflammation or other significant finding. Disc levels: Sagittal images demonstrate no significant disc space findings from T10-11 through T12-L1. L1-2: Loss of disc height with annular disc bulging and endplate osteophytes. Mild foraminal narrowing bilaterally without nerve root encroachment. L2-3: Loss of disc height with annular disc bulging and endplate osteophytes asymmetric to the left. Mild facet and ligamentous hypertrophy. These factors contribute to asymmetric narrowing of the left lateral recess and left foramen. L3-4: Loss of disc height with annular disc bulging and endplate osteophytes asymmetric to the left. Mild resulting spinal  stenosis with mild narrowing of the lateral recesses and left foramen. L4-5: Moderate multifactorial spinal stenosis secondary to annular disc bulging, a small central disc protrusion and facet/ ligamentous hypertrophy. There is mild narrowing of both lateral recesses and foramina, right-greater-than-left. L5-S1: Prominent endplate degenerative changes with endplate edema and enhancement asymmetric to the right. There is a possible small right paracentral disc extrusion with caudal migration and encroachment on the right S1 nerve root, best seen on axial image 27 of series 6. There is moderate facet and ligamentous hypertrophy with a small synovial cyst projecting posteriorly from the right  facet joint. This causes no nerve root encroachment, although there is mild-to-moderate right foraminal narrowing due to osteophytes. IMPRESSION: 1. Multilevel spondylosis with associated endplate degenerative changes as described, most advanced at L5-S1. These findings may contribute to back pain. No specific evidence of discitis or osteomyelitis. 2. Mild narrowing of the left lateral recess and left foramen at L2-3. 3. Mild multifactorial spinal stenosis at L3-4 with mild narrowing of the lateral recesses and left foramen. 4. Moderate multifactorial spinal stenosis at L4-5 with mild narrowing of both lateral recesses and foramina. 5. Possible small right paracentral disc extrusion with caudal migration and right S1 nerve root encroachment at L5-S1. Electronically Signed   By: Carey Bullocks M.D.   On: 04/07/2017 15:55   Ct Abdomen Pelvis W Contrast  Result Date: 04/06/2017 CLINICAL DATA:  Patient has traveled snow, onset of low back pain radiating down right leg. Nausea and vomiting today. Back pain, cancer or infection suspected EXAM: CT ABDOMEN AND PELVIS WITH CONTRAST TECHNIQUE: Multidetector CT imaging of the abdomen and pelvis was performed using the standard protocol following bolus administration of intravenous  contrast. CONTRAST:  80mL ISOVUE-300 IOPAMIDOL (ISOVUE-300) INJECTION 61% COMPARISON:  Chest and pelvis radiographs earlier this day. FINDINGS: Lower chest: Bilateral lower lobe atelectasis with trace pleural effusions. Mild cardiomegaly with coronary artery calcifications, patient is post CABG. No basilar pneumothorax. Hepatobiliary: Streak artifact from arms down positioning, otherwise homogeneous hepatic attenuation. No evidence of focal lesion or traumatic injury. Gallbladder physiologically distended, no calcified stone. No biliary dilatation. Pancreas: No ductal dilatation or inflammation. Spleen: Normal in size.  Splenule at the hilum. Adrenals/Urinary Tract: No adrenal nodule or hemorrhage. No hydronephrosis. Homogeneous renal enhancement with symmetric excretion on delayed phase imaging. Nonobstructing 4 mm stone in the right kidney. Subcentimeter hypodensity in the lower left kidney is too small to characterize but likely cyst. Mild nonspecific bilateral perinephric edema. Urinary bladder is physiologically distended without wall thickening. Stomach/Bowel: Mild distal esophageal wall thickening. Stomach physiologically distended. Lack of enteric contrast limits detailed bowel assessment. No small bowel obstruction, wall thickening or inflammatory change. Fecalization of distal small bowel contents suggest slow transit. Normal appendix. Cecum, ascending, and transverse colon distended with air and stool. Moderate stool burden in the distended colon. Small volume of stool in the rectosigmoid colon. No colonic inflammation. Vascular/Lymphatic: Moderate aortic atherosclerosis. No aneurysm. No acute vascular abnormality. No enlarged abdominal or pelvic lymph nodes. Reproductive: Heterogeneous enlarged prostate gland. Prostate measures 6.3 x 5.7 x 5.4 cm (volume = 100 cm^3), with mass effect on the bladder base. Other: Laxity of right inguinal canal with minimal fat, no frank hernia. No free air, free fluid, or  intra-abdominal fluid collection. Musculoskeletal: Multilevel degenerative change throughout the lumbar spine with diffuse degenerative disc disease and facet arthropathy. No evidence of acute fracture. The bony pelvis is intact. No focal bone lesion. IMPRESSION: 1. Large proximal colonic stool burden with fecalization of distal small bowel contents suggesting slow transit/constipation. No obstruction or bowel inflammation. 2. Heterogeneous enlarged prostate gland. Recommend correlation with PSA. 3. Other than prostatic enlargement which can be seen with prostate cancer, no findings of malignancy or infection in the abdomen or pelvis. 4. Multilevel degenerative change in the lumbar spine without acute fracture. 5.  Aortic Atherosclerosis (ICD10-I70.0). Electronically Signed   By: Rubye Oaks M.D.   On: 04/06/2017 22:30        Scheduled Meds: . aspirin EC  81 mg Oral QHS  . atorvastatin  80 mg Oral q1800  . dexamethasone  4 mg Intravenous Q12H  . enoxaparin (LOVENOX) injection  40 mg Subcutaneous Q24H  . losartan  50 mg Oral Daily  . metoprolol tartrate  25 mg Oral Daily  . multivitamin with minerals  1 tablet Oral Daily  . PARoxetine  20 mg Oral BID  . polyethylene glycol  17 g Oral Daily  . sorbitol, milk of mag, mineral oil, glycerin (SMOG) enema  960 mL Rectal Once   Continuous Infusions: . cefTRIAXone (ROCEPHIN)  IV Stopped (04/08/17 0035)     LOS: 0 days    Time spent: 25 min    Joseph Art, DO [facility] Pager [phone]  If 7PM-7AM, please contact night-coverage [email or web address] Password [email or web address] 04/08/2017, 2:36 PM

## 2017-04-09 DIAGNOSIS — R509 Fever, unspecified: Secondary | ICD-10-CM

## 2017-04-09 DIAGNOSIS — I10 Essential (primary) hypertension: Secondary | ICD-10-CM

## 2017-04-09 MED ORDER — PREDNISONE 10 MG (21) PO TBPK: ORAL_TABLET | ORAL | 0 refills | Status: AC

## 2017-04-09 MED ORDER — HYDROCODONE-ACETAMINOPHEN 5-325 MG PO TABS: 1.0000 | ORAL_TABLET | ORAL | 0 refills | Status: AC | PRN

## 2017-04-09 MED ORDER — CEFUROXIME AXETIL 500 MG PO TABS: 500.0000 mg | ORAL_TABLET | Freq: Two times a day (BID) | ORAL | 0 refills | Status: AC

## 2017-04-09 MED ORDER — METHOCARBAMOL 500 MG PO TABS: 500.0000 mg | ORAL_TABLET | Freq: Four times a day (QID) | ORAL | 0 refills | Status: AC | PRN

## 2017-04-09 MED ORDER — POLYETHYLENE GLYCOL 3350 17 G PO PACK: 17.0000 g | PACK | Freq: Every day | ORAL | 0 refills | Status: AC

## 2017-04-09 MED ORDER — BISACODYL 10 MG RE SUPP: 10.0000 mg | Freq: Every day | RECTAL | 0 refills | Status: AC | PRN

## 2017-04-09 NOTE — Progress Notes (Signed)
OT Cancellation Note  Patient Details Name: Brad Caldwell MRN: [id number] DOB: [birth year]   Cancelled Treatment:    Reason Eval/Treat Not Completed: Patient declined, no reason specified;Other (comment). Pt politely declined due to preparing to d/c home with family and recently got back into bed after working with PT  Galen ManilaSpencer, Janesa Dockery Jeanette 04/09/2017, 12:37 PM

## 2017-04-09 NOTE — Discharge Summary (Signed)
Physician Discharge Summary  Brad Caldwell ZOX:[id number] DOB: 01-25-1930 DOA: 04/06/2017  PCP: Patient, No Pcp Per  Admit date: 04/06/2017 Discharge date: 04/09/2017   Recommendations for Outpatient Follow-Up:   1. Home health 2. Follow up with PCP for pain control and resolution of prostatitis   Discharge Diagnosis:   Principal Problem:   Intractable low back pain Active Problems:   SIRS (systemic inflammatory response syndrome) (HCC)   Enlarged prostate   Coronary artery disease   Essential hypertension   CKD (chronic kidney disease), stage II   Discharge disposition:  Home  Discharge Condition: Improved.  Diet recommendation: Low sodium, heart healthy  Wound care: None.   History of Present Illness:   Brad Caldwell a 83 y.o.malewith medical history significant forhypertension and coronary artery disease, now presenting to the emergency department for evaluation of severe low back pain, urinary urgency, and urinary frequency. Patient reports that he had been in his usual state of fairly good health until approximately 2 days ago when, after shoveling snow, he developed severe pain across the lower back. He has also been experiencing urinary urgency and urgency, as well as nausea without vomiting since that time. He denies any history of back pain. He has not felt feverish. Denies any abdominal pain or diarrhea. No cough, shortness of breath, sore throat, or rhinorrhea.     Hospital Course by Problem:   Intractable low back pain -Possibly musculoskeletal given onset after shoveling snow -muscle relaxers, PO pain control -PT- home health  Constipation -bowel regimen -had large BM before d/c  Fever due to prostatitis -cultures did not grow anything -improved -IV rocephin-- changed to PO abx to finish course  Enlarged prostate -PSA done and normal  Hypertension -BP at goal -Continue losartan and Lopressor  CAD -No  anginal complaints -Continue ASA, statin, ARB, beta-blocker  CKD stage II -SCr is stable        Medical Consultants:    None.   Discharge Exam:   Vitals:   04/08/17 1954 04/09/17 0450  BP: 134/68 (!) 164/67  Pulse: (!) 52 (!) 54  Resp: 18 20  Temp: 98 F (36.7 C) 97.8 F (36.6 C)  SpO2: 96% 95%   Vitals:   04/08/17 0500 04/08/17 1300 04/08/17 1954 04/09/17 0450  BP: (!) 180/76 (!) 157/65 134/68 (!) 164/67  Pulse: 65 (!) 52 (!) 52 (!) 54  Resp: 16 16 18 20   Temp: 98.3 F (36.8 C) 98.1 F (36.7 C) 98 F (36.7 C) 97.8 F (36.6 C)  TempSrc: Oral Oral Oral Oral  SpO2: 96% 96% 96% 95%  Weight:      Height:        Gen:  NAD    The results of significant diagnostics from this hospitalization (including imaging, microbiology, ancillary and laboratory) are listed below for reference.     Procedures and Diagnostic Studies:   Dg Chest 1 View  Result Date: 04/06/2017 CLINICAL DATA:  Low back pain after shoveling snow a few days ago. EXAM: CHEST 1 VIEW COMPARISON:  February 23, 2009 FINDINGS: The cardiac silhouette appears enlarged. There is a tortuous thoracic aorta. The hila and mediastinum are otherwise normal. The pleura and lungs are normal. No other acute abnormalities. IMPRESSION: The cardiac silhouette appears enlarged which is a new finding since 2010. The finding may be exaggerated due to the AP portable technique. No other abnormalities. Electronically Signed   By: Gerome Sam III M.D   On: 04/06/2017 18:40   Dg Pelvis  1-2 Views  Result Date: 04/06/2017 CLINICAL DATA:  Posterior pelvic pain. EXAM: PELVIS - 1-2 VIEW COMPARISON:  None. FINDINGS: The patient is rotated to the right. There is lower lumbar degenerative disc disease of the included lumbar spine from L3 through S1. Associated facet arthropathy is noted. The sacroiliac joints and pubic symphysis appear intact. No acute fracture nor suspicious osseous lesions. Large amount of stool within  the cecum and ascending colon limit fine bony detail of the right iliac bone and sacral ala. Hip joints are maintained bilaterally. Right inguinal clips are noted. IMPRESSION: Lower lumbar degenerative disc and facet arthropathy. No acute pelvic or hip fracture. No joint dislocations. Electronically Signed   By: Tollie Eth M.D.   On: 04/06/2017 18:43   Mr Lumbar Spine W Wo Contrast  Result Date: 04/07/2017 CLINICAL DATA:  Back pain for less than 6 weeks. Chronic kidney disease. History of hypertension. EXAM: MRI LUMBAR SPINE WITHOUT AND WITH CONTRAST TECHNIQUE: Multiplanar and multiecho pulse sequences of the lumbar spine were obtained without and with intravenous contrast. CONTRAST:  13mL MULTIHANCE GADOBENATE DIMEGLUMINE 529 MG/ML IV SOLN COMPARISON:  Abdominopelvic CT 04/06/2017. FINDINGS: Segmentation: Conventional anatomy assumed, with the last open disc space designated L5-S1. This is concordant with recent abdominal CT. Alignment: Mild convex right scoliosis. Near anatomic lateral alignment. Vertebrae: There are multilevel endplate degenerative changes. These are most advanced at L1-2 where there are Modic 2 changes and at L5-S1 where there are Modic 1 changes asymmetric to the right. There is associated endplate enhancement at L5-S1, but no bone destruction, suspicious disc signal or abnormal disc enhancement. Probable scattered hemangiomas, largest in the right aspect of the L2 vertebral body. No evidence of acute fracture or pars defect. The visualized sacroiliac joints appear unremarkable. Conus medullaris: Extends to the L1-2 level and appears normal. No abnormal intradural enhancement. Paraspinal and other soft tissues: No evidence of paraspinal inflammation or other significant finding. Disc levels: Sagittal images demonstrate no significant disc space findings from T10-11 through T12-L1. L1-2: Loss of disc height with annular disc bulging and endplate osteophytes. Mild foraminal narrowing  bilaterally without nerve root encroachment. L2-3: Loss of disc height with annular disc bulging and endplate osteophytes asymmetric to the left. Mild facet and ligamentous hypertrophy. These factors contribute to asymmetric narrowing of the left lateral recess and left foramen. L3-4: Loss of disc height with annular disc bulging and endplate osteophytes asymmetric to the left. Mild resulting spinal stenosis with mild narrowing of the lateral recesses and left foramen. L4-5: Moderate multifactorial spinal stenosis secondary to annular disc bulging, a small central disc protrusion and facet/ ligamentous hypertrophy. There is mild narrowing of both lateral recesses and foramina, right-greater-than-left. L5-S1: Prominent endplate degenerative changes with endplate edema and enhancement asymmetric to the right. There is a possible small right paracentral disc extrusion with caudal migration and encroachment on the right S1 nerve root, best seen on axial image 27 of series 6. There is moderate facet and ligamentous hypertrophy with a small synovial cyst projecting posteriorly from the right facet joint. This causes no nerve root encroachment, although there is mild-to-moderate right foraminal narrowing due to osteophytes. IMPRESSION: 1. Multilevel spondylosis with associated endplate degenerative changes as described, most advanced at L5-S1. These findings may contribute to back pain. No specific evidence of discitis or osteomyelitis. 2. Mild narrowing of the left lateral recess and left foramen at L2-3. 3. Mild multifactorial spinal stenosis at L3-4 with mild narrowing of the lateral recesses and left foramen. 4. Moderate multifactorial  spinal stenosis at L4-5 with mild narrowing of both lateral recesses and foramina. 5. Possible small right paracentral disc extrusion with caudal migration and right S1 nerve root encroachment at L5-S1. Electronically Signed   By: Carey BullocksWilliam  Veazey M.D.   On: 04/07/2017 15:55   Ct  Abdomen Pelvis W Contrast  Result Date: 04/06/2017 CLINICAL DATA:  Patient has traveled snow, onset of low back pain radiating down right leg. Nausea and vomiting today. Back pain, cancer or infection suspected EXAM: CT ABDOMEN AND PELVIS WITH CONTRAST TECHNIQUE: Multidetector CT imaging of the abdomen and pelvis was performed using the standard protocol following bolus administration of intravenous contrast. CONTRAST:  80mL ISOVUE-300 IOPAMIDOL (ISOVUE-300) INJECTION 61% COMPARISON:  Chest and pelvis radiographs earlier this day. FINDINGS: Lower chest: Bilateral lower lobe atelectasis with trace pleural effusions. Mild cardiomegaly with coronary artery calcifications, patient is post CABG. No basilar pneumothorax. Hepatobiliary: Streak artifact from arms down positioning, otherwise homogeneous hepatic attenuation. No evidence of focal lesion or traumatic injury. Gallbladder physiologically distended, no calcified stone. No biliary dilatation. Pancreas: No ductal dilatation or inflammation. Spleen: Normal in size.  Splenule at the hilum. Adrenals/Urinary Tract: No adrenal nodule or hemorrhage. No hydronephrosis. Homogeneous renal enhancement with symmetric excretion on delayed phase imaging. Nonobstructing 4 mm stone in the right kidney. Subcentimeter hypodensity in the lower left kidney is too small to characterize but likely cyst. Mild nonspecific bilateral perinephric edema. Urinary bladder is physiologically distended without wall thickening. Stomach/Bowel: Mild distal esophageal wall thickening. Stomach physiologically distended. Lack of enteric contrast limits detailed bowel assessment. No small bowel obstruction, wall thickening or inflammatory change. Fecalization of distal small bowel contents suggest slow transit. Normal appendix. Cecum, ascending, and transverse colon distended with air and stool. Moderate stool burden in the distended colon. Small volume of stool in the rectosigmoid colon. No colonic  inflammation. Vascular/Lymphatic: Moderate aortic atherosclerosis. No aneurysm. No acute vascular abnormality. No enlarged abdominal or pelvic lymph nodes. Reproductive: Heterogeneous enlarged prostate gland. Prostate measures 6.3 x 5.7 x 5.4 cm (volume = 100 cm^3), with mass effect on the bladder base. Other: Laxity of right inguinal canal with minimal fat, no frank hernia. No free air, free fluid, or intra-abdominal fluid collection. Musculoskeletal: Multilevel degenerative change throughout the lumbar spine with diffuse degenerative disc disease and facet arthropathy. No evidence of acute fracture. The bony pelvis is intact. No focal bone lesion. IMPRESSION: 1. Large proximal colonic stool burden with fecalization of distal small bowel contents suggesting slow transit/constipation. No obstruction or bowel inflammation. 2. Heterogeneous enlarged prostate gland. Recommend correlation with PSA. 3. Other than prostatic enlargement which can be seen with prostate cancer, no findings of malignancy or infection in the abdomen or pelvis. 4. Multilevel degenerative change in the lumbar spine without acute fracture. 5.  Aortic Atherosclerosis (ICD10-I70.0). Electronically Signed   By: Rubye OaksMelanie  Ehinger M.D.   On: 04/06/2017 22:30     Labs:   Basic Metabolic Panel: Recent Labs  Lab 04/06/17 1753 04/07/17 0609  NA 135 138  K 3.9 4.0  CL 103 104  CO2 24 26  GLUCOSE 114* 108*  BUN 22* 21*  CREATININE 1.28* 1.20  CALCIUM 8.9 8.7*   GFR Estimated Creatinine Clearance: 42.1 mL/min (by C-G formula based on SCr of 1.2 mg/dL). Liver Function Tests: Recent Labs  Lab 04/06/17 1753  AST 29  ALT 17  ALKPHOS 62  BILITOT 1.2  PROT 7.0  ALBUMIN 3.8   No results for input(s): LIPASE, AMYLASE in the last 168 hours. No  results for input(s): AMMONIA in the last 168 hours. Coagulation profile No results for input(s): INR, PROTIME in the last 168 hours.  CBC: Recent Labs  Lab 04/06/17 1753 04/07/17 0609    WBC 9.6 8.4  NEUTROABS 6.1  --   HGB 13.2 12.0*  HCT 39.1 35.6*  MCV 93.5 92.5  PLT 154 148*   Cardiac Enzymes: Recent Labs  Lab 04/07/17 1239  CKTOTAL 358   BNP: Invalid input(s): POCBNP CBG: No results for input(s): GLUCAP in the last 168 hours. D-Dimer No results for input(s): DDIMER in the last 72 hours. Hgb A1c No results for input(s): HGBA1C in the last 72 hours. Lipid Profile No results for input(s): CHOL, HDL, LDLCALC, TRIG, CHOLHDL, LDLDIRECT in the last 72 hours. Thyroid function studies No results for input(s): TSH, T4TOTAL, T3FREE, THYROIDAB in the last 72 hours.  Invalid input(s): FREET3 Anemia work up No results for input(s): VITAMINB12, FOLATE, FERRITIN, TIBC, IRON, RETICCTPCT in the last 72 hours. Microbiology Recent Results (from the past 240 hour(s))  Urine Culture     Status: Abnormal   Collection Time: 04/06/17  5:50 PM  Result Value Ref Range Status   Specimen Description URINE, RANDOM  Final   Special Requests NONE  Final   Culture MULTIPLE SPECIES PRESENT, SUGGEST RECOLLECTION (A)  Final   Report Status 04/08/2017 FINAL  Final  Blood Culture (routine x 2)     Status: None (Preliminary result)   Collection Time: 04/06/17  7:02 PM  Result Value Ref Range Status   Specimen Description BLOOD LEFT ANTECUBITAL  Final   Special Requests   Final    BOTTLES DRAWN AEROBIC AND ANAEROBIC Blood Culture adequate volume   Culture NO GROWTH 2 DAYS  Final   Report Status PENDING  Incomplete  Blood Culture (routine x 2)     Status: None (Preliminary result)   Collection Time: 04/06/17  7:12 PM  Result Value Ref Range Status   Specimen Description BLOOD LEFT ANTECUBITAL  Final   Special Requests   Final    BOTTLES DRAWN AEROBIC AND ANAEROBIC Blood Culture adequate volume   Culture NO GROWTH 2 DAYS  Final   Report Status PENDING  Incomplete     Discharge Instructions:   Discharge Instructions    Diet - low sodium heart healthy   Complete by:  As  directed    Discharge instructions   Complete by:  As directed    Bowel regimen as pain medication may worsen constipation   Increase activity slowly   Complete by:  As directed      Allergies as of 04/09/2017      Reactions   Latex Other (See Comments)   REACTION: respiratory arrest   Codeine Nausea And Vomiting   Lisinopril Other (See Comments)   Unknown reaction      Medication List    STOP taking these medications   propranolol 80 MG tablet Commonly known as:  INDERAL     TAKE these medications   aspirin EC 81 MG tablet Take 81 mg by mouth at bedtime.   atorvastatin 80 MG tablet Commonly known as:  LIPITOR Take 80 mg by mouth at bedtime.   bisacodyl 10 MG suppository Commonly known as:  DULCOLAX Place 1 suppository (10 mg total) rectally daily as needed for moderate constipation.   cefUROXime 500 MG tablet Commonly known as:  CEFTIN Take 1 tablet (500 mg total) by mouth 2 (two) times daily for 5 days.   GOLD BOND INTENSIVE  HEALING 1-6 % Crea Generic drug:  Pramoxine-Dimethicone Apply 1 application topically daily as needed (rash on legs).   HYDROcodone-acetaminophen 5-325 MG tablet Commonly known as:  NORCO/VICODIN Take 1-2 tablets by mouth every 4 (four) hours as needed for moderate pain.   loratadine 10 MG tablet Commonly known as:  CLARITIN Take 10 mg by mouth daily as needed for allergies.   losartan 100 MG tablet Commonly known as:  COZAAR Take 50 mg by mouth daily.   methocarbamol 500 MG tablet Commonly known as:  ROBAXIN Take 1 tablet (500 mg total) by mouth every 6 (six) hours as needed for muscle spasms.   metoprolol tartrate 50 MG tablet Commonly known as:  LOPRESSOR Take 25 mg by mouth daily.   multivitamin with minerals Tabs tablet Take 1 tablet by mouth daily.   PARoxetine 40 MG tablet Commonly known as:  PAXIL Take 20 mg by mouth 2 (two) times daily. For anxiety/nerves   polyethylene glycol packet Commonly known as:  MIRALAX /  GLYCOLAX Take 17 g by mouth daily. Start taking on:  04/10/2017   predniSONE 10 MG (21) Tbpk tablet Commonly known as:  STERAPRED UNI-PAK 21 TAB 40 mg x 2 days then 30 mg x 2 day then 20 mg x 2 day then 10 mg x2 day then 5 mg x 2 days then stop      Follow-up Information    follow up with PCP 1 week Follow up.            Time coordinating discharge: 35 min  Signed:  Joseph ArtJessica U Vann   [facility] 04/09/2017, 9:12 AM

## 2017-04-09 NOTE — Progress Notes (Signed)
Discharge instructions and prescriptions reviewed with patient and family, verbalized understanding. Patient and family continue to refuse home health. IV removed, catheter tip intact. Condom cath removed, no issues. No questions/concerns at this time. Transported via wheelchair to lobby for discharge.

## 2017-04-09 NOTE — Progress Notes (Signed)
Physical Therapy Treatment Patient Details Name: Brad BurrowCharles E Andreoni MRN: [id number] DOB: [birth year] Today's Date: 04/09/2017    History of Present Illness Pt is an 83 y/o male admitted secondary to intractable back pain after shoveling snow. MRI revealed multilevel spondylosis, however, no acute abnormality. CT of the abdomen revealed enlarged prostate. PMH includes CHD, HTN, CAD s/p CABG, and pericarditis.     PT Comments    Pt showing increased activity tolerance in today's session. He ambulated 400 ft in hall and negotiated 2 steps with min guard for safety. Pt reports improvement in LBP with ambulation. Noted tremors in BIL UEs. Pt reports tremor is new. RN notified. He would benefit from continued skilled PT with focus on body mechanics to increase safety with mobility. Expected to d/c this PM with HHPT to follow up.    Follow Up Recommendations  Home health PT;Supervision/Assistance - 24 hour     Equipment Recommendations  None recommended by PT    Recommendations for Other Services OT consult     Precautions / Restrictions Precautions Precautions: Fall Restrictions Weight Bearing Restrictions: No    Mobility  Bed Mobility               General bed mobility comments: in chair on arrival  Transfers Overall transfer level: Needs assistance Equipment used: Rolling walker (2 wheeled) Transfers: Sit to/from Stand Sit to Stand: Min guard         General transfer comment: min guard for safety. Pt's UE with slight tremor.  Ambulation/Gait Ambulation/Gait assistance: Min guard Ambulation Distance (Feet): 400 Feet Assistive device: Rolling walker (2 wheeled) Gait Pattern/deviations: Step-through pattern;Decreased stride length Gait velocity: Decreased  Gait velocity interpretation: Below normal speed for age/gender General Gait Details: Pt with slow steady gait. Some initial shaking however gait quality improved with distance. Pt required min guard for  safety   Stairs Stairs: Yes   Stair Management: One rail Right;Step to pattern;Sideways Number of Stairs: 2 General stair comments: Pt able to negotiate steps with min guard for safety. VC and demonstration provided for technique to negotiate steps sideways.   Wheelchair Mobility    Modified Rankin (Stroke Patients Only)       Balance Overall balance assessment: Needs assistance Sitting-balance support: Bilateral upper extremity supported;Feet supported Sitting balance-Leahy Scale: Fair Sitting balance - Comments: LOB noted with dynamic tasks. Required at least one UE support.    Standing balance support: Bilateral upper extremity supported;During functional activity Standing balance-Leahy Scale: Poor Standing balance comment: Reliant on BUE support                             Cognition Arousal/Alertness: Awake/alert Behavior During Therapy: WFL for tasks assessed/performed Overall Cognitive Status: Within Functional Limits for tasks assessed                                        Exercises      General Comments General comments (skin integrity, edema, etc.): Wife and son present at end of session      Pertinent Vitals/Pain Pain Assessment: 0-10 Pain Score: 2  Pain Location: back  Pain Descriptors / Indicators: Aching;Grimacing;Guarding Pain Intervention(s): Monitored during session;Limited activity within patient's tolerance;Repositioned    Home Living                      Prior Function  PT Goals (current goals can now be found in the care plan section) Acute Rehab PT Goals Patient Stated Goal: to move like I was before  PT Goal Formulation: With patient Time For Goal Achievement: 04/22/17 Potential to Achieve Goals: Good Progress towards PT goals: Progressing toward goals    Frequency    Min 3X/week      PT Plan Current plan remains appropriate    Co-evaluation              AM-PAC PT "6  Clicks" Daily Activity  Outcome Measure  Difficulty turning over in bed (including adjusting bedclothes, sheets and blankets)?: Unable Difficulty moving from lying on back to sitting on the side of the bed? : Unable Difficulty sitting down on and standing up from a chair with arms (e.g., wheelchair, bedside commode, etc,.)?: Unable Help needed moving to and from a bed to chair (including a wheelchair)?: A Little Help needed walking in hospital room?: A Little Help needed climbing 3-5 steps with a railing? : A Little 6 Click Score: 12    End of Session Equipment Utilized During Treatment: Gait belt Activity Tolerance: Patient tolerated treatment well Patient left: in chair;with family/visitor present;with call bell/phone within reach Nurse Communication: Mobility status PT Visit Diagnosis: Unsteadiness on feet (R26.81);Other abnormalities of gait and mobility (R26.89);Pain Pain - part of body: (back )     Time: [id number] PT Time Calculation (min) (ACUTE ONLY): 23 min  Charges:  $Gait Training: 23-37 mins                    G Codes:      Kallie LocksHannah Miki Labuda, [place]PT[place] Pager [phone] Acute Rehab  Sheral ApleyHannah E Pariss Hommes 04/09/2017, 11:12 AM

## 2017-04-09 NOTE — Care Management Note (Signed)
Case Management Note  Patient Details  Name: Brad BurrowCharles E Cieslak MRN: [id number] Date of Birth: 03-11-1930  Subjective/Objective:   83 yr old gentleman admitted with back pain s/p fall.                  Action/Plan:  Case manager spoke with patient concerning discharge plan. Choice for Home Health was offered, patient politely declined, He says he knows what to do and has a gym at home. Patient will have family support at discharge.    Expected Discharge Date:  04/09/17               Expected Discharge Plan:  Home/Self Care  In-House Referral:     Discharge planning Services  CM Consult  Post Acute Care Choice:  Home Health Choice offered to:  Patient, Spouse, Adult Children  DME Arranged:  (no needs) DME Agency:     HH Arranged:  Patient Refused HH HH Agency:  NA  Status of Service:  Completed, signed off  If discussed at Long Length of Stay Meetings, dates discussed:    Additional Comments:  Durenda GuthrieBrady, Jeanelle Dake Naomi, RN 04/09/2017, 11:43 AM

## 2017-04-11 LAB — CULTURE, BLOOD (ROUTINE X 2)
CULTURE: NO GROWTH
Culture: NO GROWTH
SPECIAL REQUESTS: ADEQUATE
SPECIAL REQUESTS: ADEQUATE

## 2017-06-10 DIAGNOSIS — I2510 Atherosclerotic heart disease of native coronary artery without angina pectoris: Secondary | ICD-10-CM | POA: Diagnosis not present

## 2017-06-10 DIAGNOSIS — Z23 Encounter for immunization: Secondary | ICD-10-CM | POA: Diagnosis not present

## 2017-06-10 DIAGNOSIS — I129 Hypertensive chronic kidney disease with stage 1 through stage 4 chronic kidney disease, or unspecified chronic kidney disease: Secondary | ICD-10-CM | POA: Diagnosis not present

## 2017-06-10 DIAGNOSIS — Z0000 Encounter for general adult medical examination without abnormal findings: Secondary | ICD-10-CM | POA: Diagnosis not present

## 2017-06-10 DIAGNOSIS — E782 Mixed hyperlipidemia: Secondary | ICD-10-CM | POA: Diagnosis not present

## 2017-06-18 DIAGNOSIS — E782 Mixed hyperlipidemia: Secondary | ICD-10-CM | POA: Diagnosis not present

## 2017-06-18 DIAGNOSIS — I2510 Atherosclerotic heart disease of native coronary artery without angina pectoris: Secondary | ICD-10-CM | POA: Diagnosis not present

## 2017-06-18 DIAGNOSIS — N183 Chronic kidney disease, stage 3 (moderate): Secondary | ICD-10-CM | POA: Diagnosis not present

## 2017-06-18 DIAGNOSIS — I129 Hypertensive chronic kidney disease with stage 1 through stage 4 chronic kidney disease, or unspecified chronic kidney disease: Secondary | ICD-10-CM | POA: Diagnosis not present

## 2018-01-07 DIAGNOSIS — E782 Mixed hyperlipidemia: Secondary | ICD-10-CM | POA: Diagnosis not present

## 2018-01-07 DIAGNOSIS — I129 Hypertensive chronic kidney disease with stage 1 through stage 4 chronic kidney disease, or unspecified chronic kidney disease: Secondary | ICD-10-CM | POA: Diagnosis not present

## 2018-01-07 DIAGNOSIS — I2510 Atherosclerotic heart disease of native coronary artery without angina pectoris: Secondary | ICD-10-CM | POA: Diagnosis not present

## 2018-01-14 DIAGNOSIS — E782 Mixed hyperlipidemia: Secondary | ICD-10-CM | POA: Diagnosis not present

## 2018-01-14 DIAGNOSIS — I129 Hypertensive chronic kidney disease with stage 1 through stage 4 chronic kidney disease, or unspecified chronic kidney disease: Secondary | ICD-10-CM | POA: Diagnosis not present

## 2018-01-14 DIAGNOSIS — I2510 Atherosclerotic heart disease of native coronary artery without angina pectoris: Secondary | ICD-10-CM | POA: Diagnosis not present

## 2018-06-24 DIAGNOSIS — Z0000 Encounter for general adult medical examination without abnormal findings: Secondary | ICD-10-CM | POA: Diagnosis not present

## 2018-06-24 DIAGNOSIS — I2510 Atherosclerotic heart disease of native coronary artery without angina pectoris: Secondary | ICD-10-CM | POA: Diagnosis not present

## 2018-06-24 DIAGNOSIS — I129 Hypertensive chronic kidney disease with stage 1 through stage 4 chronic kidney disease, or unspecified chronic kidney disease: Secondary | ICD-10-CM | POA: Diagnosis not present

## 2018-06-24 DIAGNOSIS — N390 Urinary tract infection, site not specified: Secondary | ICD-10-CM | POA: Diagnosis not present

## 2018-06-24 DIAGNOSIS — E782 Mixed hyperlipidemia: Secondary | ICD-10-CM | POA: Diagnosis not present

## 2018-07-01 DIAGNOSIS — E782 Mixed hyperlipidemia: Secondary | ICD-10-CM | POA: Diagnosis not present

## 2018-07-01 DIAGNOSIS — Z0000 Encounter for general adult medical examination without abnormal findings: Secondary | ICD-10-CM | POA: Diagnosis not present

## 2018-07-01 DIAGNOSIS — I700 Atherosclerosis of aorta: Secondary | ICD-10-CM | POA: Diagnosis not present

## 2018-07-01 DIAGNOSIS — I2510 Atherosclerotic heart disease of native coronary artery without angina pectoris: Secondary | ICD-10-CM | POA: Diagnosis not present

## 2021-02-28 DIAGNOSIS — I10 Essential (primary) hypertension: Secondary | ICD-10-CM | POA: Diagnosis not present

## 2021-02-28 DIAGNOSIS — R5383 Other fatigue: Secondary | ICD-10-CM | POA: Diagnosis not present

## 2021-02-28 DIAGNOSIS — E782 Mixed hyperlipidemia: Secondary | ICD-10-CM | POA: Diagnosis not present

## 2021-03-07 DIAGNOSIS — I700 Atherosclerosis of aorta: Secondary | ICD-10-CM | POA: Diagnosis not present

## 2021-03-07 DIAGNOSIS — F0390 Unspecified dementia without behavioral disturbance: Secondary | ICD-10-CM | POA: Diagnosis not present

## 2021-03-07 DIAGNOSIS — E782 Mixed hyperlipidemia: Secondary | ICD-10-CM | POA: Diagnosis not present

## 2021-03-07 DIAGNOSIS — Z0000 Encounter for general adult medical examination without abnormal findings: Secondary | ICD-10-CM | POA: Diagnosis not present

## 2021-05-02 DIAGNOSIS — F419 Anxiety disorder, unspecified: Secondary | ICD-10-CM | POA: Diagnosis not present

## 2021-08-28 DIAGNOSIS — N1831 Chronic kidney disease, stage 3a: Secondary | ICD-10-CM | POA: Diagnosis not present

## 2021-08-28 DIAGNOSIS — E782 Mixed hyperlipidemia: Secondary | ICD-10-CM | POA: Diagnosis not present

## 2021-08-28 DIAGNOSIS — F0390 Unspecified dementia without behavioral disturbance: Secondary | ICD-10-CM | POA: Diagnosis not present

## 2021-08-28 DIAGNOSIS — I700 Atherosclerosis of aorta: Secondary | ICD-10-CM | POA: Diagnosis not present

## 2021-09-04 DIAGNOSIS — I700 Atherosclerosis of aorta: Secondary | ICD-10-CM | POA: Diagnosis not present

## 2021-09-04 DIAGNOSIS — F419 Anxiety disorder, unspecified: Secondary | ICD-10-CM | POA: Diagnosis not present

## 2021-09-04 DIAGNOSIS — E782 Mixed hyperlipidemia: Secondary | ICD-10-CM | POA: Diagnosis not present

## 2021-09-04 DIAGNOSIS — I2510 Atherosclerotic heart disease of native coronary artery without angina pectoris: Secondary | ICD-10-CM | POA: Diagnosis not present

## 2022-03-05 DIAGNOSIS — E782 Mixed hyperlipidemia: Secondary | ICD-10-CM | POA: Diagnosis not present

## 2022-03-05 DIAGNOSIS — F419 Anxiety disorder, unspecified: Secondary | ICD-10-CM | POA: Diagnosis not present

## 2022-03-05 DIAGNOSIS — I2510 Atherosclerotic heart disease of native coronary artery without angina pectoris: Secondary | ICD-10-CM | POA: Diagnosis not present

## 2022-03-05 DIAGNOSIS — I700 Atherosclerosis of aorta: Secondary | ICD-10-CM | POA: Diagnosis not present

## 2022-03-12 DIAGNOSIS — I700 Atherosclerosis of aorta: Secondary | ICD-10-CM | POA: Diagnosis not present

## 2022-03-12 DIAGNOSIS — F0390 Unspecified dementia without behavioral disturbance: Secondary | ICD-10-CM | POA: Diagnosis not present

## 2022-03-12 DIAGNOSIS — E782 Mixed hyperlipidemia: Secondary | ICD-10-CM | POA: Diagnosis not present

## 2022-03-12 DIAGNOSIS — Z0000 Encounter for general adult medical examination without abnormal findings: Secondary | ICD-10-CM | POA: Diagnosis not present

## 2022-08-22 DIAGNOSIS — K080 Exfoliation of teeth due to systemic causes: Secondary | ICD-10-CM | POA: Diagnosis not present

## 2022-09-23 DIAGNOSIS — I700 Atherosclerosis of aorta: Secondary | ICD-10-CM | POA: Diagnosis not present

## 2022-09-23 DIAGNOSIS — E782 Mixed hyperlipidemia: Secondary | ICD-10-CM | POA: Diagnosis not present

## 2022-09-23 DIAGNOSIS — F0390 Unspecified dementia without behavioral disturbance: Secondary | ICD-10-CM | POA: Diagnosis not present

## 2022-09-23 DIAGNOSIS — N1831 Chronic kidney disease, stage 3a: Secondary | ICD-10-CM | POA: Diagnosis not present

## 2022-09-30 DIAGNOSIS — E782 Mixed hyperlipidemia: Secondary | ICD-10-CM | POA: Diagnosis not present

## 2022-09-30 DIAGNOSIS — N1831 Chronic kidney disease, stage 3a: Secondary | ICD-10-CM | POA: Diagnosis not present

## 2022-09-30 DIAGNOSIS — F0390 Unspecified dementia without behavioral disturbance: Secondary | ICD-10-CM | POA: Diagnosis not present

## 2022-09-30 DIAGNOSIS — I700 Atherosclerosis of aorta: Secondary | ICD-10-CM | POA: Diagnosis not present

## 2022-10-04 DIAGNOSIS — K080 Exfoliation of teeth due to systemic causes: Secondary | ICD-10-CM | POA: Diagnosis not present

## 2023-03-25 DIAGNOSIS — G250 Essential tremor: Secondary | ICD-10-CM | POA: Diagnosis not present

## 2023-03-25 DIAGNOSIS — Z23 Encounter for immunization: Secondary | ICD-10-CM | POA: Diagnosis not present

## 2023-03-25 DIAGNOSIS — Z0000 Encounter for general adult medical examination without abnormal findings: Secondary | ICD-10-CM | POA: Diagnosis not present

## 2023-03-25 DIAGNOSIS — E782 Mixed hyperlipidemia: Secondary | ICD-10-CM | POA: Diagnosis not present

## 2023-03-25 DIAGNOSIS — G301 Alzheimer's disease with late onset: Secondary | ICD-10-CM | POA: Diagnosis not present

## 2023-03-25 DIAGNOSIS — F02B0 Dementia in other diseases classified elsewhere, moderate, without behavioral disturbance, psychotic disturbance, mood disturbance, and anxiety: Secondary | ICD-10-CM | POA: Diagnosis not present

## 2023-03-25 DIAGNOSIS — F419 Anxiety disorder, unspecified: Secondary | ICD-10-CM | POA: Diagnosis not present

## 2023-03-25 DIAGNOSIS — I700 Atherosclerosis of aorta: Secondary | ICD-10-CM | POA: Diagnosis not present

## 2023-03-25 DIAGNOSIS — N1831 Chronic kidney disease, stage 3a: Secondary | ICD-10-CM | POA: Diagnosis not present

## 2023-03-25 DIAGNOSIS — F0390 Unspecified dementia without behavioral disturbance: Secondary | ICD-10-CM | POA: Diagnosis not present

## 2023-05-10 ENCOUNTER — Ambulatory Visit
Admission: EM | Admit: 2023-05-10 | Discharge: 2023-05-10 | Disposition: A | Discharge status: Home / Self Care | Payer: Medicare Other | Attending: Family Medicine | Admitting: Family Medicine

## 2023-05-10 DIAGNOSIS — R300 Dysuria: Secondary | ICD-10-CM | POA: Diagnosis not present

## 2023-05-10 DIAGNOSIS — N50812 Left testicular pain: Secondary | ICD-10-CM | POA: Diagnosis not present

## 2023-05-10 DIAGNOSIS — N50811 Right testicular pain: Secondary | ICD-10-CM | POA: Diagnosis not present

## 2023-05-10 LAB — POCT URINALYSIS DIP (MANUAL ENTRY)
Bilirubin, UA: NEGATIVE
Glucose, UA: NEGATIVE mg/dL
Ketones, POC UA: NEGATIVE mg/dL
Nitrite, UA: NEGATIVE
Protein Ur, POC: NEGATIVE mg/dL
Spec Grav, UA: 1.01
Urobilinogen, UA: 0.2 U/dL
pH, UA: 5

## 2023-05-10 NOTE — Discharge Instructions (Signed)
Make sure you hydrate very well with plain water and a quantity of 64 ounces of water a day.  Please limit drinks that are considered urinary irritants such as soda, sweet tea, coffee, energy drinks, alcohol.  These can worsen your urinary and genital symptoms but also be the source of them.  I will let you know about your urine culture results through MyChart to see if we need to prescribe or change your antibiotics based off of those results. Tomorrow I will call you with your blood test results and prescribe an antibiotic that can help you with your suspected urinary tract infection, orchitis infection.

## 2023-05-10 NOTE — ED Triage Notes (Signed)
Pt c/o dysuria and testicle pain x 5 days-NAD-slow gait

## 2023-05-10 NOTE — ED Provider Notes (Signed)
[facility]  Note:  This document was prepared using Conservation officer, historic buildings and may include unintentional dictation errors.  MRN: [id number] DOB: 04/28/1929  Subjective:   Brad Caldwell is a 89 y.o. male presenting for 5-day history of persistent dysuria, urinary frequency and urgency, testicular pain.  Patient has a history of an enlarged prostate.  Primary concern is that he has an urinary tract infection, prostatitis per his son.  No hematuria, fever, abdominal pain, confusion.  Patient is at his baseline.  Has a history of CKD stage II but no recent blood work.  No current facility-administered medications for this encounter.  Current Outpatient Medications:    primidone (MYSOLINE) 50 MG tablet, Take 50 mg by mouth 4 (four) times daily., Disp: , Rfl:    aspirin EC 81 MG tablet, Take 81 mg by mouth at bedtime., Disp: , Rfl:    atorvastatin (LIPITOR) 80 MG tablet, Take 80 mg by mouth at bedtime. , Disp: , Rfl:    bisacodyl (DULCOLAX) 10 MG suppository, Place 1 suppository (10 mg total) rectally daily as needed for moderate constipation., Disp: 12 suppository, Rfl: 0   HYDROcodone-acetaminophen (NORCO/VICODIN) 5-325 MG tablet, Take 1-2 tablets by mouth every 4 (four) hours as needed for moderate pain., Disp: 30 tablet, Rfl: 0   loratadine (CLARITIN) 10 MG tablet, Take 10 mg by mouth daily as needed for allergies., Disp: , Rfl:    losartan (COZAAR) 100 MG tablet, Take 50 mg by mouth daily., Disp: , Rfl:    methocarbamol (ROBAXIN) 500 MG tablet, Take 1 tablet (500 mg total) by mouth every 6 (six) hours as needed for muscle spasms., Disp: 30 tablet, Rfl: 0   metoprolol tartrate (LOPRESSOR) 50 MG tablet, Take 25 mg by mouth daily., Disp: , Rfl:    Multiple Vitamin (MULTIVITAMIN WITH MINERALS) TABS tablet, Take 1 tablet by mouth daily., Disp: , Rfl:    PARoxetine (PAXIL) 40 MG tablet, Take 20 mg by mouth 2 (two) times daily. For anxiety/nerves, Disp: , Rfl:     polyethylene glycol (MIRALAX / GLYCOLAX) packet, Take 17 g by mouth daily., Disp: 14 each, Rfl: 0   Pramoxine-Dimethicone (GOLD BOND INTENSIVE HEALING) 1-6 % CREA, Apply 1 application topically daily as needed (rash on legs)., Disp: , Rfl:    predniSONE (STERAPRED UNI-PAK 21 TAB) 10 MG (21) TBPK tablet, 40 mg x 2 days then 30 mg x 2 day then 20 mg x 2 day then 10 mg x2 day then 5 mg x 2 days then stop, Disp: 21 tablet, Rfl: 0   Allergies  Allergen Reactions   Latex Other (See Comments)    REACTION: respiratory arrest   Codeine Nausea And Vomiting   Lisinopril Other (See Comments)    Unknown reaction    Past Medical History:  Diagnosis Date   Coronary artery disease    Dyslipidemia    Hypertension    Pericarditis    mild postoperative     Past Surgical History:  Procedure Laterality Date   ARTERIOVENOUS GRAFT PLACEMENT W/ ENDOSCOPIC VEIN HARVEST     right leg   CARDIAC CATHETERIZATION     CORONARY ARTERY BYPASS GRAFT      No family history on file.  Social History   Tobacco Use   Smoking status: Never   Smokeless tobacco: Never  Vaping Use   Vaping status: Never Used  Substance Use Topics   Alcohol use: Not Currently   Drug use: Never  ROS   Objective:   Vitals: BP 132/77 (BP Location: Right Arm)   Pulse (!) 59   Temp (!) 97.5 F (36.4 C) (Oral)   Resp 20   SpO2 98%   Physical Exam Constitutional:      General: He is not in acute distress.    Appearance: Normal appearance. He is well-developed and normal weight. He is not ill-appearing, toxic-appearing or diaphoretic.  HENT:     Head: Normocephalic and atraumatic.     Right Ear: External ear normal.     Left Ear: External ear normal.     Nose: Nose normal.     Mouth/Throat:     Pharynx: Oropharynx is clear.  Eyes:     General: No scleral icterus.       Right eye: No discharge.        Left eye: No discharge.     Extraocular Movements: Extraocular movements intact.  Cardiovascular:     Rate  and Rhythm: Normal rate.  Pulmonary:     Effort: Pulmonary effort is normal.  Abdominal:     General: Bowel sounds are normal. There is no distension.     Palpations: Abdomen is soft. There is no mass.     Tenderness: There is no abdominal tenderness. There is no right CVA tenderness, left CVA tenderness, guarding or rebound.  Musculoskeletal:     Cervical back: Normal range of motion.  Neurological:     Mental Status: He is alert and oriented to person, place, and time.  Psychiatric:        Mood and Affect: Mood normal.        Behavior: Behavior normal.        Thought Content: Thought content normal.        Judgment: Judgment normal.     Results for orders placed or performed during the hospital encounter of 05/10/23 (from the past 24 hours)  POCT urinalysis dipstick     Status: Abnormal   Collection Time: 05/10/23  1:43 PM  Result Value Ref Range   Color, UA yellow    Clarity, UA clear    Glucose, UA negative mg/dL   Bilirubin, UA negative    Ketones, POC UA negative mg/dL   Spec Grav, UA 5.956    Blood, UA trace-intact (A)    pH, UA 5.0    Protein Ur, POC negative mg/dL   Urobilinogen, UA 0.2 E.U./dL   Nitrite, UA Negative    Leukocytes, UA Trace (A)     Assessment and Plan :   PDMP not reviewed this encounter.  1. Dysuria   2. Pain in both testicles    Will treat empirically for acute cystitis.  However, I advised obtaining a basic metabolic panel to update his creatinine level and prescribe antibiotics appropriately so as to avoid an acute kidney injury secondary to medication.  Hydrate very well and consistently.  Urine culture pending.  Counseled patient on potential for adverse effects with medications prescribed/recommended today, ER and return-to-clinic precautions discussed, patient verbalized understanding.    Wallis Bamberg, PA-C 05/10/23 1544

## 2023-05-11 LAB — BASIC METABOLIC PANEL
BUN/Creatinine Ratio: 12 (ref 10–24)
BUN: 17 mg/dL (ref 10–36)
CO2: 21 mmol/L (ref 20–29)
Calcium: 8.9 mg/dL (ref 8.6–10.2)
Chloride: 104 mmol/L (ref 96–106)
Creatinine, Ser: 1.41 mg/dL — ABNORMAL HIGH (ref 0.76–1.27)
Glucose: 99 mg/dL (ref 70–99)
Potassium: 5 mmol/L (ref 3.5–5.2)
Sodium: 137 mmol/L (ref 134–144)
eGFR: 47 mL/min/{1.73_m2} — ABNORMAL LOW (ref >59)

## 2023-05-12 LAB — URINE CULTURE: Culture: NO GROWTH
